# Patient Record
Sex: Female | Born: 1960 | Race: White | Hispanic: No | Marital: Married | Smoking: Never smoker
Health system: Southern US, Community
[De-identification: ages and names within clinical notes are randomized; demographics above are authoritative.]

## PROBLEM LIST (undated history)

## (undated) DIAGNOSIS — R51 Headache: Secondary | ICD-10-CM

## (undated) DIAGNOSIS — Z780 Asymptomatic menopausal state: Secondary | ICD-10-CM

## (undated) HISTORY — PX: TONSILLECTOMY: SUR1361

## (undated) HISTORY — DX: Headache: R51

## (undated) HISTORY — DX: Asymptomatic menopausal state: Z78.0

## (undated) HISTORY — PX: INGUINAL HERNIA REPAIR: SUR1180

---

## 2001-09-30 ENCOUNTER — Other Ambulatory Visit: Admission: RE | Admit: 2001-09-30 | Discharge: 2001-09-30 | Payer: Self-pay | Admitting: Obstetrics and Gynecology

## 2003-09-25 ENCOUNTER — Other Ambulatory Visit: Admission: RE | Admit: 2003-09-25 | Discharge: 2003-09-25 | Payer: Self-pay | Admitting: Obstetrics and Gynecology

## 2005-01-16 ENCOUNTER — Other Ambulatory Visit: Admission: RE | Admit: 2005-01-16 | Discharge: 2005-01-16 | Payer: Self-pay | Admitting: Obstetrics and Gynecology

## 2005-05-26 ENCOUNTER — Ambulatory Visit: Payer: Self-pay | Admitting: Internal Medicine

## 2005-06-12 ENCOUNTER — Ambulatory Visit: Payer: Self-pay | Admitting: Internal Medicine

## 2005-06-15 ENCOUNTER — Ambulatory Visit: Payer: Self-pay | Admitting: Internal Medicine

## 2006-03-12 ENCOUNTER — Ambulatory Visit: Payer: Self-pay | Admitting: Family Medicine

## 2006-04-20 ENCOUNTER — Ambulatory Visit: Payer: Self-pay | Admitting: Internal Medicine

## 2006-06-09 ENCOUNTER — Ambulatory Visit: Payer: Self-pay | Admitting: Internal Medicine

## 2006-09-07 ENCOUNTER — Ambulatory Visit: Payer: Self-pay | Admitting: Family Medicine

## 2006-10-01 ENCOUNTER — Ambulatory Visit: Payer: Self-pay | Admitting: Family Medicine

## 2006-10-13 ENCOUNTER — Ambulatory Visit: Payer: Self-pay | Admitting: Family Medicine

## 2006-11-12 ENCOUNTER — Ambulatory Visit: Payer: Self-pay | Admitting: Family Medicine

## 2006-11-12 LAB — CONVERTED CEMR LAB
Basophils Relative: 1.2 % — ABNORMAL HIGH (ref 0.0–1.0)
Iron: 48 ug/dL (ref 42–145)
Lymphocytes Relative: 37.4 % (ref 12.0–46.0)
MCHC: 33 g/dL (ref 30.0–36.0)
Monocytes Absolute: 0.4 10*3/uL (ref 0.2–0.7)
Monocytes Relative: 9.2 % (ref 3.0–11.0)
Neutro Abs: 2.1 10*3/uL (ref 1.4–7.7)
Platelets: 237 10*3/uL (ref 150–400)
Saturation Ratios: 10.2 % — ABNORMAL LOW (ref 20.0–50.0)
Transferrin: 336 mg/dL (ref >=212.0)

## 2007-02-14 DIAGNOSIS — R519 Headache, unspecified: Secondary | ICD-10-CM | POA: Insufficient documentation

## 2007-02-14 DIAGNOSIS — R51 Headache: Secondary | ICD-10-CM | POA: Insufficient documentation

## 2008-07-06 ENCOUNTER — Encounter: Admission: RE | Admit: 2008-07-06 | Discharge: 2008-07-06 | Payer: Self-pay | Admitting: Obstetrics and Gynecology

## 2008-08-15 ENCOUNTER — Ambulatory Visit: Payer: Self-pay | Admitting: Internal Medicine

## 2008-08-15 DIAGNOSIS — N390 Urinary tract infection, site not specified: Secondary | ICD-10-CM | POA: Insufficient documentation

## 2008-08-15 LAB — CONVERTED CEMR LAB
Bilirubin Urine: NEGATIVE
Nitrite: POSITIVE
Protein, U semiquant: NEGATIVE
Specific Gravity, Urine: <1.005
Urobilinogen, UA: 0.2
pH: 7.5

## 2008-08-16 ENCOUNTER — Encounter: Payer: Self-pay | Admitting: Internal Medicine

## 2008-08-16 LAB — CONVERTED CEMR LAB: RBC / HPF: NONE SEEN (ref <3)

## 2008-09-28 ENCOUNTER — Telehealth: Payer: Self-pay | Admitting: Internal Medicine

## 2008-10-02 ENCOUNTER — Ambulatory Visit: Payer: Self-pay | Admitting: Internal Medicine

## 2008-10-02 LAB — CONVERTED CEMR LAB
Bilirubin Urine: NEGATIVE
Ketones, urine, test strip: NEGATIVE
Nitrite: POSITIVE
Protein, U semiquant: NEGATIVE
RBC / HPF: NONE SEEN (ref <3)
Urobilinogen, UA: 0.2
pH: 7

## 2008-10-03 ENCOUNTER — Encounter: Payer: Self-pay | Admitting: Internal Medicine

## 2008-10-08 ENCOUNTER — Telehealth (INDEPENDENT_AMBULATORY_CARE_PROVIDER_SITE_OTHER): Payer: Self-pay | Admitting: *Deleted

## 2008-10-23 ENCOUNTER — Ambulatory Visit: Payer: Self-pay | Admitting: Internal Medicine

## 2008-10-24 ENCOUNTER — Encounter: Payer: Self-pay | Admitting: Internal Medicine

## 2008-10-24 LAB — CONVERTED CEMR LAB
Bilirubin Urine: NEGATIVE
Nitrite: NEGATIVE
Protein, ur: NEGATIVE mg/dL
Specific Gravity, Urine: 1.018 (ref 1.005–1.03)
Urobilinogen, UA: 0.2 (ref 0.0–1.0)

## 2008-10-30 ENCOUNTER — Telehealth (INDEPENDENT_AMBULATORY_CARE_PROVIDER_SITE_OTHER): Payer: Self-pay | Admitting: *Deleted

## 2009-04-01 ENCOUNTER — Ambulatory Visit: Payer: Self-pay | Admitting: Internal Medicine

## 2009-04-01 ENCOUNTER — Telehealth (INDEPENDENT_AMBULATORY_CARE_PROVIDER_SITE_OTHER): Payer: Self-pay | Admitting: *Deleted

## 2009-08-01 ENCOUNTER — Encounter: Admission: RE | Admit: 2009-08-01 | Discharge: 2009-08-01 | Payer: Self-pay | Admitting: Obstetrics and Gynecology

## 2010-10-07 ENCOUNTER — Encounter: Admission: RE | Admit: 2010-10-07 | Discharge: 2010-10-07 | Payer: Self-pay | Admitting: Obstetrics and Gynecology

## 2011-12-02 ENCOUNTER — Other Ambulatory Visit: Payer: Self-pay | Admitting: Obstetrics and Gynecology

## 2011-12-02 DIAGNOSIS — R928 Other abnormal and inconclusive findings on diagnostic imaging of breast: Secondary | ICD-10-CM

## 2011-12-08 ENCOUNTER — Ambulatory Visit
Admission: RE | Admit: 2011-12-08 | Discharge: 2011-12-08 | Disposition: A | Discharge status: Home / Self Care | Payer: PRIVATE HEALTH INSURANCE | Source: Ambulatory Visit | Attending: Obstetrics and Gynecology | Admitting: Obstetrics and Gynecology

## 2011-12-08 ENCOUNTER — Other Ambulatory Visit: Payer: Self-pay | Admitting: Obstetrics and Gynecology

## 2011-12-08 DIAGNOSIS — R928 Other abnormal and inconclusive findings on diagnostic imaging of breast: Secondary | ICD-10-CM

## 2011-12-09 ENCOUNTER — Telehealth: Payer: Self-pay

## 2011-12-09 NOTE — Telephone Encounter (Signed)
Message left on voicemail: Patient would like to be a kidney donor, patient will be 51 in March. Patient was told she will need to have a colonoscopy as one of the pre-qualifiying factors.   Last appointment 07/2008. Dr.Paz please advise on patient's request for referral for colonoscopy (forward to [facility])

## 2011-12-09 NOTE — Telephone Encounter (Signed)
I haven't seen her in more than 3 years, she really needs a physical exam before anything else. Schedule it here if so desired or with her gynecologist

## 2011-12-10 NOTE — Telephone Encounter (Signed)
Spoke with patient, patient will contact GYN

## 2012-02-16 HISTORY — PX: KIDNEY DONATION: SHX685

## 2012-07-05 ENCOUNTER — Ambulatory Visit (INDEPENDENT_AMBULATORY_CARE_PROVIDER_SITE_OTHER): Payer: PRIVATE HEALTH INSURANCE | Admitting: Family Medicine

## 2012-07-05 ENCOUNTER — Encounter: Payer: Self-pay | Admitting: Family Medicine

## 2012-07-05 VITALS — BP 100/64 | HR 71 | Temp 98.3°F | Wt 112.4 lb

## 2012-07-05 DIAGNOSIS — J069 Acute upper respiratory infection, unspecified: Secondary | ICD-10-CM

## 2012-07-05 NOTE — Progress Notes (Signed)
  Subjective:     Monica Sweeney is a 51 y.o. female here for evaluation of a cough. Onset of symptoms was 1 month ago. Symptoms have been unchanged since that time. The cough is mostly dry and is aggravated by  activity. Associated symptoms include: postnasal drip and sputum production. Patient does not have a history of asthma. Patient does not have a history of environmental allergens. Patient has not traveled recently. Patient does not have a history of smoking. Patient has not had a previous chest x-ray. Patient has not had a PPD done.  The following portions of the patient's history were reviewed and updated as appropriate: allergies, current medications, past family history, past medical history, past social history, past surgical history and problem list.  Review of Systems Pertinent items are noted in HPI.    Objective:    Oxygen saturation 96% on room air BP 100/64  Pulse 71  Temp 98.3 F (36.8 C) (Oral)  Wt 112 lb 6.4 oz (50.984 kg)  SpO2 96% General appearance: alert, cooperative, appears stated age and no distress Ears: normal TM's and external ear canals both ears Nose: Nares normal. Septum midline. Mucosa normal. No drainage or sinus tenderness., clear discharge, turbinates red, no sinus tenderness Throat: abnormal findings: mild oropharyngeal erythema and pnd Neck: no adenopathy, supple, symmetrical, trachea midline and thyroid not enlarged, symmetric, no tenderness/mass/nodules Lungs: clear to auscultation bilaterally Heart: S1, S2 normal    Assessment:    URI with Post Nasal Drip    Plan:    Explained lack of efficacy of antibiotics in viral disease. Avoid exposure to tobacco smoke and fumes. Call if shortness of breath worsens, blood in sputum, change in character of cough, development of fever or chills, inability to maintain nutrition and hydration. Avoid exposure to tobacco smoke and fumes. Trial of antihistamines. Trial of steroid nasal spray.

## 2012-07-05 NOTE — Patient Instructions (Signed)

## 2012-07-20 ENCOUNTER — Ambulatory Visit (INDEPENDENT_AMBULATORY_CARE_PROVIDER_SITE_OTHER): Payer: PRIVATE HEALTH INSURANCE | Admitting: Internal Medicine

## 2012-07-20 VITALS — BP 112/76 | HR 75 | Temp 98.1°F | Wt 112.0 lb

## 2012-07-20 DIAGNOSIS — R05 Cough: Secondary | ICD-10-CM

## 2012-07-20 DIAGNOSIS — R059 Cough, unspecified: Secondary | ICD-10-CM

## 2012-07-20 MED ORDER — HYDROCODONE-HOMATROPINE 5-1.5 MG/5ML PO SYRP: 5.0000 mL | ORAL_SOLUTION | Freq: Three times a day (TID) | ORAL | Status: AC | PRN

## 2012-07-20 MED ORDER — DOXYCYCLINE HYCLATE 100 MG PO TABS: 100.0000 mg | ORAL_TABLET | Freq: Two times a day (BID) | ORAL | Status: AC

## 2012-07-20 NOTE — Patient Instructions (Addendum)
Rest, fluids  For cough, take Mucinex DM twice a day as needed  If the cough continue, use hydrocodone as needed Prilosec 20 mg OTC one every morning before breakfast for 2 weeks Take the antibiotic as prescribed  (doxycycline) Call if no better in few days Call anytime if the symptoms are severe

## 2012-07-20 NOTE — Assessment & Plan Note (Signed)
Persisting cough, not better with Claritin and Flonase. Atypical infection?. Plan: Doxycycline, cough suppression, Prilosec. If not better will need further eval including a chest x-ray.

## 2012-07-20 NOTE — Progress Notes (Signed)
  Subjective:    Patient ID: Monica Sweeney, female    DOB: 05/12/1961, 51 y.o.   MRN: [id number]  HPI Acute visit Was seen 2 weeks ago with cough, was prescribed Claritin and Flonase, continue with cough. She brings up very little sputum, no hemoptysis. Cough is at times severe and can't stop.  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 Denies itchy eyes itchy nose or sneezing. Sense of smell is quite decrease    Objective:   Physical Exam General -- alert, well-developed, and well-nourished.   HEENT -- TMs normal, throat w/o redness, face symmetric and not tender to palpation, nose slt congested Lungs -- normal respiratory effort, no intercostal retractions, no accessory muscle use, and normal breath sounds.   Heart-- normal rate, regular rhythm, no murmur, and no gallop.   Extremities-- no pretibial edema bilaterally Psych-- Cognition and judgment appear intact. Alert and cooperative with normal attention span and concentration.  not anxious appearing and not depressed appearing.       Assessment & Plan:

## 2012-07-21 ENCOUNTER — Encounter: Payer: Self-pay | Admitting: Internal Medicine

## 2012-07-26 ENCOUNTER — Telehealth: Payer: Self-pay | Admitting: Internal Medicine

## 2012-07-26 MED ORDER — AZITHROMYCIN 250 MG PO TABS: ORAL_TABLET | ORAL | Status: AC

## 2012-07-26 NOTE — Telephone Encounter (Signed)
Refill done.  

## 2012-07-26 NOTE — Telephone Encounter (Signed)
I don't see any problems taking doxycycline however if the patient is concerned we can switch her to a Zpack

## 2012-07-26 NOTE — Telephone Encounter (Signed)
Pt called states she only has 1-kidney and the doxycycline states not to take if you have kidney issues. Pt does not want to take as it could be a possible health risk. Please call patient back on cell [phone] as soon as possible

## 2012-07-26 NOTE — Telephone Encounter (Signed)
Please advise 

## 2012-09-02 ENCOUNTER — Ambulatory Visit (INDEPENDENT_AMBULATORY_CARE_PROVIDER_SITE_OTHER): Payer: PRIVATE HEALTH INSURANCE | Admitting: Internal Medicine

## 2012-09-02 VITALS — BP 102/66 | HR 69 | Wt 111.0 lb

## 2012-09-02 DIAGNOSIS — Z905 Acquired absence of kidney: Secondary | ICD-10-CM

## 2012-09-02 DIAGNOSIS — N390 Urinary tract infection, site not specified: Secondary | ICD-10-CM

## 2012-09-02 LAB — POCT URINALYSIS DIPSTICK
Ketones, UA: NEGATIVE
Protein, UA: NEGATIVE
Urobilinogen, UA: 0.2
pH, UA: 7.5

## 2012-09-02 MED ORDER — CIPROFLOXACIN HCL 500 MG PO TABS: 500.0000 mg | ORAL_TABLET | Freq: Two times a day (BID) | ORAL | Status: DC

## 2012-09-02 NOTE — Patient Instructions (Addendum)
Drink plenty of fluids, ciprofloxacin twice a day for one week. We are sending a urine culture, will call you with results. If you have fever, chills, nausea or you are not completely well in 10 days, let us know

## 2012-09-02 NOTE — Progress Notes (Signed)
  Subjective:    Patient ID: Monica Sweeney, female    DOB: 06-13-1961, 51 y.o.   MRN: [id number]  HPI Acute visit 3 days ago had transient dysuria, got better after she got some cranberry juice tablets. The next day, developed right flank pain, was mild, pain is now almost resolved, not 100% gone.   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 No nausea vomiting No gross hematuria Not rash in the back. Did have some lower abdominal discomfort.     Objective:   Physical Exam General -- alert, well-developed, and well-nourished.    Abdomen--soft, non-tender, no distention, no masses, no HSM, no guarding, and no rigidity.  No CVA tenderness on either side Extremities-- no pretibial edema bilaterally  Neurologic-- alert & oriented X3 and strength normal in all extremities. Psych-- Cognition and judgment appear intact. Alert and cooperative with normal attention span and concentration.  not anxious appearing and not depressed appearing.       Assessment & Plan:   UTI, Symptoms consistent with a UTI, urinalysis shows some blood. She has a single right kidney, and some flank discomfort consequently it was prescribe abx for 7 days.  Urine culture pending Will check a BMP to ensure kidney function is normal.

## 2012-09-03 ENCOUNTER — Encounter: Payer: Self-pay | Admitting: Internal Medicine

## 2012-09-03 DIAGNOSIS — Z905 Acquired absence of kidney: Secondary | ICD-10-CM | POA: Insufficient documentation

## 2012-09-03 LAB — BASIC METABOLIC PANEL
BUN: 15 mg/dL (ref 6–23)
CO2: 28 mEq/L (ref 19–32)
Calcium: 10.7 mg/dL — ABNORMAL HIGH (ref 8.4–10.5)
Glucose, Bld: 85 mg/dL (ref 70–99)
Potassium: 3.8 mEq/L (ref 3.5–5.3)
Sodium: 140 mEq/L (ref 135–145)

## 2012-09-05 ENCOUNTER — Encounter: Payer: Self-pay | Admitting: *Deleted

## 2012-09-08 ENCOUNTER — Telehealth: Payer: Self-pay

## 2012-09-08 NOTE — Telephone Encounter (Signed)
Pt called in wanting copy of last OV for another appt. Advised pt I would mail it.    MW

## 2012-09-28 ENCOUNTER — Ambulatory Visit
Admission: RE | Admit: 2012-09-28 | Discharge: 2012-09-28 | Disposition: A | Discharge status: Home / Self Care | Payer: PRIVATE HEALTH INSURANCE | Source: Ambulatory Visit | Attending: Internal Medicine | Admitting: Internal Medicine

## 2012-09-28 ENCOUNTER — Ambulatory Visit (INDEPENDENT_AMBULATORY_CARE_PROVIDER_SITE_OTHER): Payer: PRIVATE HEALTH INSURANCE | Admitting: Internal Medicine

## 2012-09-28 ENCOUNTER — Encounter: Payer: Self-pay | Admitting: Internal Medicine

## 2012-09-28 VITALS — BP 98/64 | HR 97 | Temp 98.6°F | Wt 115.0 lb

## 2012-09-28 DIAGNOSIS — R059 Cough, unspecified: Secondary | ICD-10-CM

## 2012-09-28 DIAGNOSIS — R05 Cough: Secondary | ICD-10-CM

## 2012-09-28 DIAGNOSIS — R51 Headache: Secondary | ICD-10-CM

## 2012-09-28 DIAGNOSIS — N390 Urinary tract infection, site not specified: Secondary | ICD-10-CM

## 2012-09-28 LAB — POCT URINALYSIS DIPSTICK
Bilirubin, UA: NEGATIVE
Ketones, UA: NEGATIVE
Protein, UA: NEGATIVE
pH, UA: 6.5

## 2012-09-28 NOTE — Progress Notes (Signed)
  Subjective:    Patient ID: Monica Sweeney, female    DOB: [birth year], 51 y.o.   MRN: [id number]  HPI Acute visit Was awakened in the middle of the night last night with severe headache, associated with nausea , she vomited twice and felt dizzy. The headache is not the worst of her life but is different from her usual migraines, located mostly at the top of the head.  She continue with cough on and off despite taking Prilosec.  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 No neck stiffness but some soreness No sinus pain, postnasal dripping at baseline and mild. No abdominal pain or diarrhea.    Objective:   Physical Exam  General -- alert, well-developed, and well-nourished.   Neck --FROM  HEENT-- tympanic membranes normal, nose is slightly congested, face symmetric and not tender to palpation. Without redness Lungs -- normal respiratory effort, no intercostal retractions, no accessory muscle use, and normal breath sounds.   Heart-- normal rate, regular rhythm, no murmur, and no gallop.   Abdomen--soft, non-tender, no distention, no masses, no HSM, no guarding, and no rigidity.   Neurologic-- alert & oriented X3, speech, gait and motor are intact. Pupils equal and reactive, EOMI, DTRs symmetric. Psych-- Cognition and judgment appear intact. Alert and cooperative with normal attention span and concentration.  not anxious appearing and not depressed appearing.      Assessment & Plan:  Pt's cell [phone]

## 2012-09-28 NOTE — Assessment & Plan Note (Addendum)
Presents with acute, splitting headache, associated with 2 episodes of nausea and some dizziness. Headache is different to previous migraines. Neurological exam is normal. I think is important to rule out the ICB, will schedule a CT. If CT is negative, will prescribe Tylenol. Patient knows to call me (even if CT is negative) if she has ongoing headache for > than  3 days or if she has another severe headache. Addendum, CT negative, patient aware

## 2012-09-28 NOTE — Patient Instructions (Addendum)
Tylenol  500 mg OTC 2 tabs a day every 8 hours as needed for pain --- Will get a CT of the head and  a chest x-ray today If the headache is not going away in 3 days or gets worse, please call us

## 2012-09-28 NOTE — Assessment & Plan Note (Addendum)
Ongoing issue, on Prilosec. Plan: Chest x-ray Refer to Dr. Sherene Sires Addendum, chest x-ray negative except for bronchitis. Patient aware

## 2012-09-28 NOTE — Assessment & Plan Note (Signed)
Status post Cipro, now asymptomatic, U dip positive. Plan: Urine culture, treat if appropriate

## 2012-10-07 ENCOUNTER — Encounter: Payer: Self-pay | Admitting: Internal Medicine

## 2012-10-07 ENCOUNTER — Ambulatory Visit (INDEPENDENT_AMBULATORY_CARE_PROVIDER_SITE_OTHER): Payer: PRIVATE HEALTH INSURANCE | Admitting: Internal Medicine

## 2012-10-07 VITALS — BP 104/68 | HR 90 | Temp 98.1°F | Ht 64.0 in | Wt 111.6 lb

## 2012-10-07 DIAGNOSIS — R05 Cough: Secondary | ICD-10-CM

## 2012-10-07 DIAGNOSIS — R059 Cough, unspecified: Secondary | ICD-10-CM

## 2012-10-07 MED ORDER — FAMOTIDINE 20 MG PO TABS: ORAL_TABLET | ORAL | Status: DC

## 2012-10-07 MED ORDER — PANTOPRAZOLE SODIUM 40 MG PO TBEC: 40.0000 mg | DELAYED_RELEASE_TABLET | Freq: Every day | ORAL | Status: DC

## 2012-10-07 NOTE — Assessment & Plan Note (Signed)
The most common causes of chronic cough in immunocompetent adults include the following: upper airway cough syndrome (UACS), previously referred to as postnasal drip syndrome (PNDS), which is caused by variety of rhinosinus conditions; (2) asthma; (3) GERD; (4) chronic bronchitis from cigarette smoking or other inhaled environmental irritants; (5) nonasthmatic eosinophilic bronchitis; and (6) bronchiectasis.   These conditions, singly or in combination, have accounted for up to 94% of the causes of chronic cough in prospective studies.   Other conditions have constituted no >6% of the causes in prospective studies These have included bronchogenic carcinoma, chronic interstitial pneumonia, sarcoidosis, left ventricular failure, ACEI-induced cough, and aspiration from a condition associated with pharyngeal dysfunction.   This is most likely a form of  Classic Upper airway cough syndrome, so named because it's frequently impossible to sort out how much is  CR/sinusitis with freq throat clearing (which can be related to primary GERD)   vs  causing  secondary (" extra esophageal")  GERD from wide swings in gastric pressure that occur with throat clearing, often  promoting self use of mint and menthol lozenges that reduce the lower esophageal sphincter tone and exacerbate the problem further in a cyclical fashion.   These are the same pts (now being labeled as having "irritable larynx syndrome" by some cough centers) who not infrequently have a history of having failed to tolerate ace inhibitors,  dry powder inhalers or biphosphonates or report having atypical reflux symptoms that don't respond to standard doses of PPI , and are easily confused as having aecopd or asthma flares by even experienced allergists/ pulmonologists.   Coughs to point of gag/vomiting so clearly is refluxing. Unfortunately we don't have good options to treat the non-acid components of gerd so need to eliminate any acid component at all  and follow strict diet to see if this eliminates the cough and if so may be a candidate for NF

## 2012-10-07 NOTE — Patient Instructions (Addendum)
Try Pantoprazole 40 mg   Take 30-60 min before first meal of the day and Pepcid 20 mg one bedtime until cough is completely gone for at least a week without the need for cough suppression   GERD (REFLUX)  is an extremely common cause of respiratory symptoms like a cough, many times with no significant heartburn at all.    It can be treated with medication, but also with lifestyle changes including avoidance of late meals, excessive alcohol, smoking cessation, and avoid fatty foods, chocolate, peppermint, colas, red wine, and acidic juices such as orange juice.  NO MINT OR MENTHOL PRODUCTS SO NO COUGH DROPS  USE SUGARLESS CANDY INSTEAD (jolley ranchers or Stover's)  NO OIL BASED VITAMINS - use powdered substitutes.  Best cough medication is robitussin DM   No follow up is needed if  you are satisfied with your treatment plan let your doctor know and he can either refill your medications or you can return here when your prescription runs out.     If in any way you are not 100% satisfied,  please tell us.  If 100% better, tell your friends!

## 2012-10-07 NOTE — Progress Notes (Signed)
  Subjective:    Patient ID: Monica Sweeney, female    DOB: November 27, 1960 MRN: [id number]  HPI  51 yowf no resp problems at all and healthy except for recurrent cough episodic and responsive to short courses of  prilosec  since around 2007 and felt to be a healthy  at donor @ [facility] kidney donation February 16 2012 but with recurrent cough Aug 2013 so started prilosec back  But cough persisted so referred 10/07/2012 to pulmonary clinic by Dr Drue Novel.  10/07/2012 1st pulmonary eval/ Bao Bazen cc cough on off x 5-6 years previously better on prilosec and much worse since August 2013 taking prilosec p bfast each am.  Cough is daily, min productive, assoc gagging to point of vomit and loss of breath.   Worse p few minutes supine and does wake her up. Hoarse/ cough with talking.  Not sob unless coughing. Denies overt hb or sinus complaints and had a neg ct head/ neg sinus dz on 09/28/12   Also denies any obvious fluctuation of symptoms with weather or environmental changes or other aggravating or alleviating factors except as outlined above        Review of Systems  Constitutional: Negative for fever and unexpected weight change.  HENT: Positive for sore throat ( pat feels something in throat--scratchy when swallowing at times) and sneezing. Negative for ear pain, nosebleeds, congestion, rhinorrhea, trouble swallowing, dental problem, postnasal drip and sinus pressure.   Eyes: Negative for redness and itching.  Respiratory: Positive for cough, chest tightness and shortness of breath. Negative for wheezing.   Cardiovascular: Negative for palpitations and leg swelling.  Gastrointestinal: Negative for nausea and vomiting.       Acid reflux  Genitourinary: Negative for dysuria.  Musculoskeletal: Negative for joint swelling.  Skin: Negative for rash.  Neurological: Negative for headaches.  Hematological: Does not bruise/bleed easily.  Psychiatric/Behavioral: Negative for dysphoric mood. The patient is not  nervous/anxious.        Objective:   Physical Exam Wt Readings from Last 3 Encounters:  10/07/12 111 lb 9.6 oz (50.621 kg)  09/28/12 115 lb (52.164 kg)  09/02/12 111 lb (50.349 kg)    HEENT: nl dentition, turbinates, and orophanx. Nl external ear canals without cough reflex   NECK :  without JVD/Nodes/TM/ nl carotid upstrokes bilaterally   LUNGS: no acc muscle use, clear to A and P bilaterally without cough on insp or exp maneuvers   CV:  RRR  no s3 or murmur or increase in P2, no edema   ABD:  soft and nontender with nl excursion in the supine position. No bruits or organomegaly, bowel sounds nl  MS:  warm without deformities, calf tenderness, cyanosis or clubbing  SKIN: warm and dry without lesions    NEURO:  alert, approp, no deficits     cxr 09/28/12 Mild bronchitic change without definite acute cardiopulmonary  disease.       Assessment & Plan:

## 2013-02-21 ENCOUNTER — Other Ambulatory Visit: Payer: Self-pay | Admitting: Internal Medicine

## 2013-04-05 ENCOUNTER — Encounter: Payer: Self-pay | Admitting: Internal Medicine

## 2013-04-05 ENCOUNTER — Ambulatory Visit (INDEPENDENT_AMBULATORY_CARE_PROVIDER_SITE_OTHER): Payer: PRIVATE HEALTH INSURANCE | Admitting: Internal Medicine

## 2013-04-05 VITALS — BP 102/74 | HR 64 | Wt 110.0 lb

## 2013-04-05 DIAGNOSIS — M7711 Lateral epicondylitis, right elbow: Secondary | ICD-10-CM

## 2013-04-05 DIAGNOSIS — M7710 Lateral epicondylitis, unspecified elbow: Secondary | ICD-10-CM

## 2013-04-05 MED ORDER — PREDNISONE 10 MG PO TABS: ORAL_TABLET | ORAL | Status: DC

## 2013-04-05 NOTE — Progress Notes (Signed)
  Subjective:    Patient ID: Monica Sweeney, female    DOB: 05-08-1961, 52 y.o.   MRN: [id number]  HPI Acute visit 6 weeks history of pain at the right elbow, pain is not getting any better, increases by picking things with her right arm or shaking hands ; initially the pain was only at the elbow, now extends proximately and distally. Has not taken any medication. No injury per se.  Past Medical History  Diagnosis Date  . Headache   . Menopause    Past Surgical History  Procedure Laterality Date  . Kidney donation  02/16/2012  . Tonsillectomy    . Inguinal hernia repair     History   Social History  . Marital Status: Married    Spouse Name: N/A    Number of Children: 2  . Years of Education: N/A   Occupational History  . Animator at [employer]   Social History Main Topics  . Smoking status: Never Smoker   . Smokeless tobacco: Never Used  . Alcohol Use: Yes     Comment: occassional  . Drug Use: No  . Sexually Active: Not on file   Other Topics Concern  . Not on file   Social History Narrative          Review of Systems Patient is very active but does not play any racquet sportsn. Denies any neck pain per se . Elbow is not swollen or red. No fever chills    Objective:   Physical Exam General -- alert, well-developed, NAD Neck --full range of motion and not tender to palpation at the cervical spine Extremities-- no pretibial edema bilaterally Left elbow normal. Right elbow without swelling or deformities, slightly tender at the external epicondyle.  Neurologic-- alert & oriented X3 ; DTRs and  strength normal in all extremities. Psych-- Cognition and judgment appear intact. Alert and cooperative with normal attention span and concentration.  not anxious appearing and not depressed appearing.       Assessment & Plan:

## 2013-04-05 NOTE — Assessment & Plan Note (Addendum)
Symptoms consistent with tennis elbow, dx discussed with the patient Avoid Motrin, Celebrex etc. Due to single kidney . Plan: Prednisone, Tylenol, ice, brace, stretching. Printed extensive information for the patient from up-to-date. Will call if no better in 3 weeks for a referral

## 2013-04-05 NOTE — Patient Instructions (Addendum)
Times in the every night and also immediately after exercise. Prednisone as prescribed Stretching twice a day Tylenol  500 mg OTC 2 tabs a day every 8 hours as needed for pain Brace OTC Call if no better in 2-3 weeks

## 2013-04-06 ENCOUNTER — Encounter: Payer: Self-pay | Admitting: Internal Medicine

## 2013-05-16 ENCOUNTER — Encounter: Payer: Self-pay | Admitting: Family Medicine

## 2013-05-16 ENCOUNTER — Ambulatory Visit (INDEPENDENT_AMBULATORY_CARE_PROVIDER_SITE_OTHER): Payer: PRIVATE HEALTH INSURANCE | Admitting: Family Medicine

## 2013-05-16 ENCOUNTER — Telehealth: Payer: Self-pay | Admitting: Internal Medicine

## 2013-05-16 VITALS — BP 94/64 | HR 62 | Temp 98.4°F

## 2013-05-16 DIAGNOSIS — R300 Dysuria: Secondary | ICD-10-CM

## 2013-05-16 LAB — POCT URINALYSIS DIPSTICK
Glucose, UA: NEGATIVE
Ketones, UA: NEGATIVE
Protein, UA: NEGATIVE
Spec Grav, UA: <=1.005

## 2013-05-16 MED ORDER — CIPROFLOXACIN HCL 500 MG PO TABS: 500.0000 mg | ORAL_TABLET | Freq: Two times a day (BID) | ORAL | Status: AC

## 2013-05-16 NOTE — Telephone Encounter (Signed)
Spoke to pt, scheduled appt w/ Dr. Laury Axon @ 4pm. Pt could not come in any earlier.

## 2013-05-16 NOTE — Patient Instructions (Addendum)
Urinary Tract Infection  Urinary tract infections (UTIs) can develop anywhere along your urinary tract. Your urinary tract is your body's drainage system for removing wastes and extra water. Your urinary tract includes two kidneys, two ureters, a bladder, and a urethra. Your kidneys are a pair of bean-shaped organs. Each kidney is about the size of your fist. They are located below your ribs, one on each side of your spine.  CAUSES  Infections are caused by microbes, which are microscopic organisms, including fungi, viruses, and bacteria. These organisms are so small that they can only be seen through a microscope. Bacteria are the microbes that most commonly cause UTIs.  SYMPTOMS   Symptoms of UTIs may vary by age and gender of the patient and by the location of the infection. Symptoms in young women typically include a frequent and intense urge to urinate and a painful, burning feeling in the bladder or urethra during urination. Older women and men are more likely to be tired, shaky, and weak and have muscle aches and abdominal pain. A fever may mean the infection is in your kidneys. Other symptoms of a kidney infection include pain in your back or sides below the ribs, nausea, and vomiting.  DIAGNOSIS  To diagnose a UTI, your caregiver will ask you about your symptoms. Your caregiver also will ask to provide a urine sample. The urine sample will be tested for bacteria and white blood cells. White blood cells are made by your body to help fight infection.  TREATMENT   Typically, UTIs can be treated with medication. Because most UTIs are caused by a bacterial infection, they usually can be treated with the use of antibiotics. The choice of antibiotic and length of treatment depend on your symptoms and the type of bacteria causing your infection.  HOME CARE INSTRUCTIONS   If you were prescribed antibiotics, take them exactly as your caregiver instructs you. Finish the medication even if you feel better after you  have only taken some of the medication.   Drink enough water and fluids to keep your urine clear or pale yellow.   Avoid caffeine, tea, and carbonated beverages. They tend to irritate your bladder.   Empty your bladder often. Avoid holding urine for long periods of time.   Empty your bladder before and after sexual intercourse.   After a bowel movement, women should cleanse from front to back. Use each tissue only once.  SEEK MEDICAL CARE IF:    You have back pain.   You develop a fever.   Your symptoms do not begin to resolve within 3 days.  SEEK IMMEDIATE MEDICAL CARE IF:    You have severe back pain or lower abdominal pain.   You develop chills.   You have nausea or vomiting.   You have continued burning or discomfort with urination.  MAKE SURE YOU:    Understand these instructions.   Will watch your condition.   Will get help right away if you are not doing well or get worse.  Document Released: 08/12/2005 Document Revised: 05/03/2012 Document Reviewed: 12/11/2011  [employer] Patient Information 2014 [employer]

## 2013-05-16 NOTE — Telephone Encounter (Signed)
Patient Information:  Caller Name: Zayonna  Phone: [phone]  Patient: Monica Sweeney, Monica Sweeney  Gender: Female  DOB: 12-May-1961  Age: 52 Years  PCP: Marga Melnick  Pregnant: No  Office Follow Up:  Does the office need to follow up with this patient?: Yes  Instructions For The Office: She worked out this week and did not change her underwear afterwards and this is a trigger for her to have UTI's...she has painful urination with some cloudy appearance starting and requesting antibiotic called into pharmacy. She has one kidney.  RN Note:  She is calling regarding potential UTI which she gets "frequently" according to her, this would be her 2nd one this year. It is having Mild-Moderate interruption of ADL's, uncomfortable and would be interrupting desire for sexual intercourse with her husband. She only has one kidney, donated the other one.She is drinking fluid and urinating but has pain with urination and slightly cloudy appearance. SHE IS REQUESTING MEDICINE BE CALLED INTO THE [facility], [phone]. PLEASE CALL AND LET HER KNOW IF SHE NEEDS APOINTMENT VERSUS COMING IN TO OFFICE TODAY, 05/16/2013.  Symptoms  Reason For Call & Symptoms: UTI (15 months ago donated kidney) pain with urination and looks like on the verge of cloudy.  Reviewed Health History In EMR: Yes  Reviewed Medications In EMR: Yes  Reviewed Allergies In EMR: Yes  Reviewed Surgeries / Procedures: Yes  Date of Onset of Symptoms: 05/14/2013 OB / GYN:  LMP: Unknown  Guideline(s) Used:  Urination Pain - Female  Disposition Per Guideline:   See Today in Office  Reason For Disposition Reached:   Age > 50 years  Advice Given:  Fluids:   Drink extra fluids. Drink 8-10 glasses of liquids a day (Reason: to produce a dilute, non-irritating urine).  Cranberry Juice:   Some people think that drinking cranberry juice may help in fighting urinary tract infections. However, there is no good research that has ever proved  this.  Dosage Cranberry Juice Cocktail: 8 oz (240 ml) twice a day.  Dosage 100% Cranberry Juice: 1 oz (30 ml) twice a day.  Cranberry Juice:   Some people think that drinking cranberry juice may help in fighting urinary tract infections. However, there is no good research that has ever proved this.  Dosage Cranberry Juice Cocktail: 8 oz (240 ml) twice a day.  Dosage 100% Cranberry Juice: 1 oz (30 ml) twice a day.  Caution: Do not drink more than 16 oz (480 ml). Here is the reason: too much cranberry juice can also be irritating to the bladder.  Warm Saline SITZ Baths to Reduce Pain:  Sit in a warm saline bath for 20 minutes to cleanse the area and to reduce pain. Add 2 oz. of table salt or baking soda to a tub of water.  Call Back If:  You become worse.  RN Overrode Recommendation:  Patient Requests Prescription  SHE IS FAMILIAR WITH UTI, AND REQUESTING MEDICINE BE CALLED INTO PHARMACY.

## 2013-05-16 NOTE — Progress Notes (Signed)
  Subjective:    Monica Sweeney is a 52 y.o. female who complains of burning with urination and frequency. She has had symptoms for 3 days. Patient also complains of none. Patient denies back pain, congestion, cough, fever, headache, rhinitis, sorethroat, stomach ache and vaginal discharge. Patient does not have a history of recurrent UTI. Patient does not have a history of pyelonephritis.   The following portions of the patient's history were reviewed and updated as appropriate: allergies, current medications, past family history, past medical history, past social history, past surgical history and problem list.  Review of Systems Pertinent items are noted in HPI.    Objective:    BP 94/64  Pulse 62  Temp(Src) 98.4 F (36.9 C) (Oral)  SpO2 96% General appearance: alert, cooperative, appears stated age and no distress  Laboratory:  Urine dipstick: small for hemoglobin and 3+ for leukocyte esterase.   Micro exam: not done.    Assessment:    UTI     Plan:    Medications: ciprofloxacin. Maintain adequate hydration. Follow up if symptoms not improving, and as needed.

## 2013-05-18 LAB — URINE CULTURE: Colony Count: >=100000

## 2013-06-01 ENCOUNTER — Ambulatory Visit: Payer: PRIVATE HEALTH INSURANCE | Admitting: Family Medicine

## 2013-12-01 IMAGING — CR DG CHEST 2V
2 series · 2 of 2 positions shown · non-contrast
Comparison: None.

CLINICAL DATA: Chronic cough, nonsmoker

CHEST - 2 VIEW

[w chest pa]
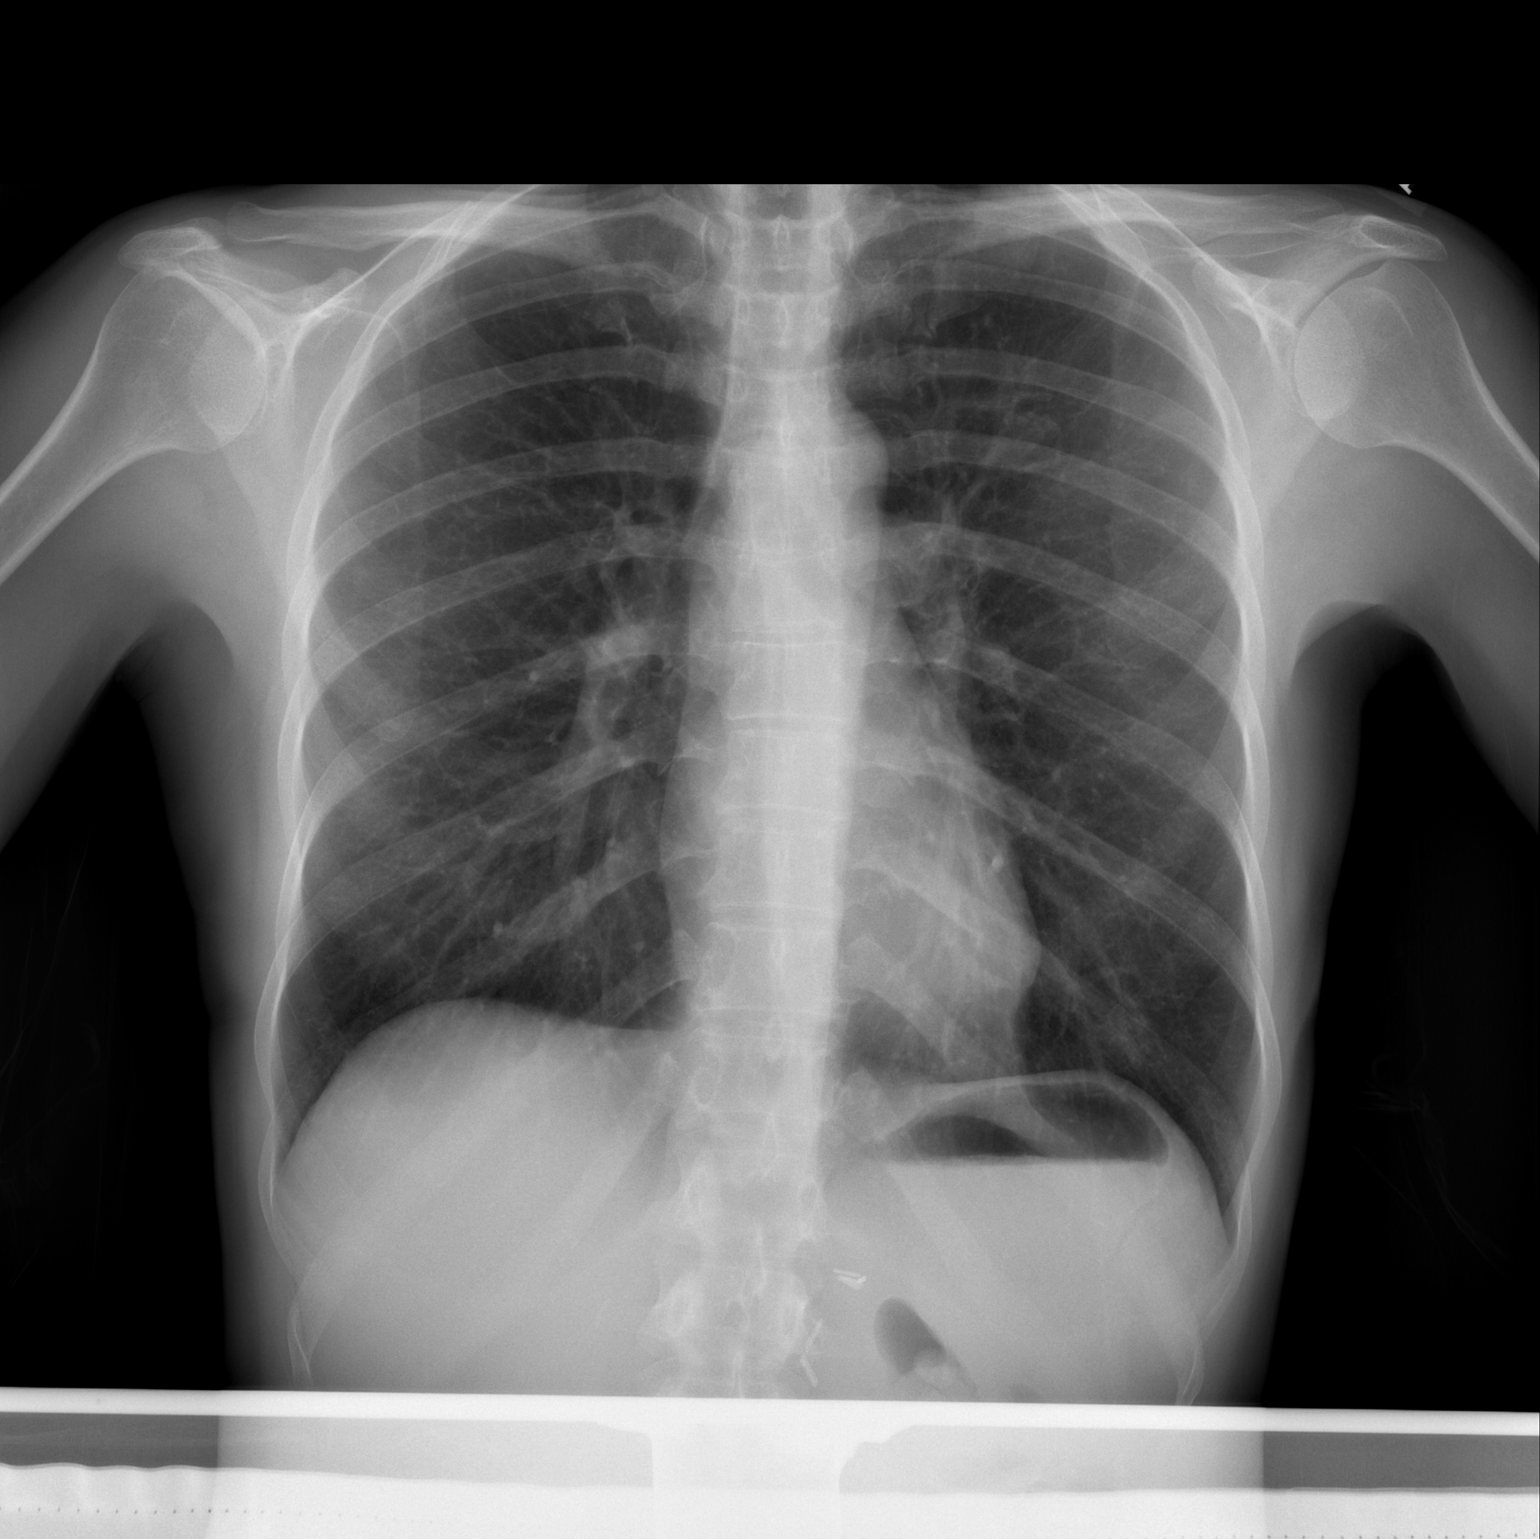

[w chest lat]
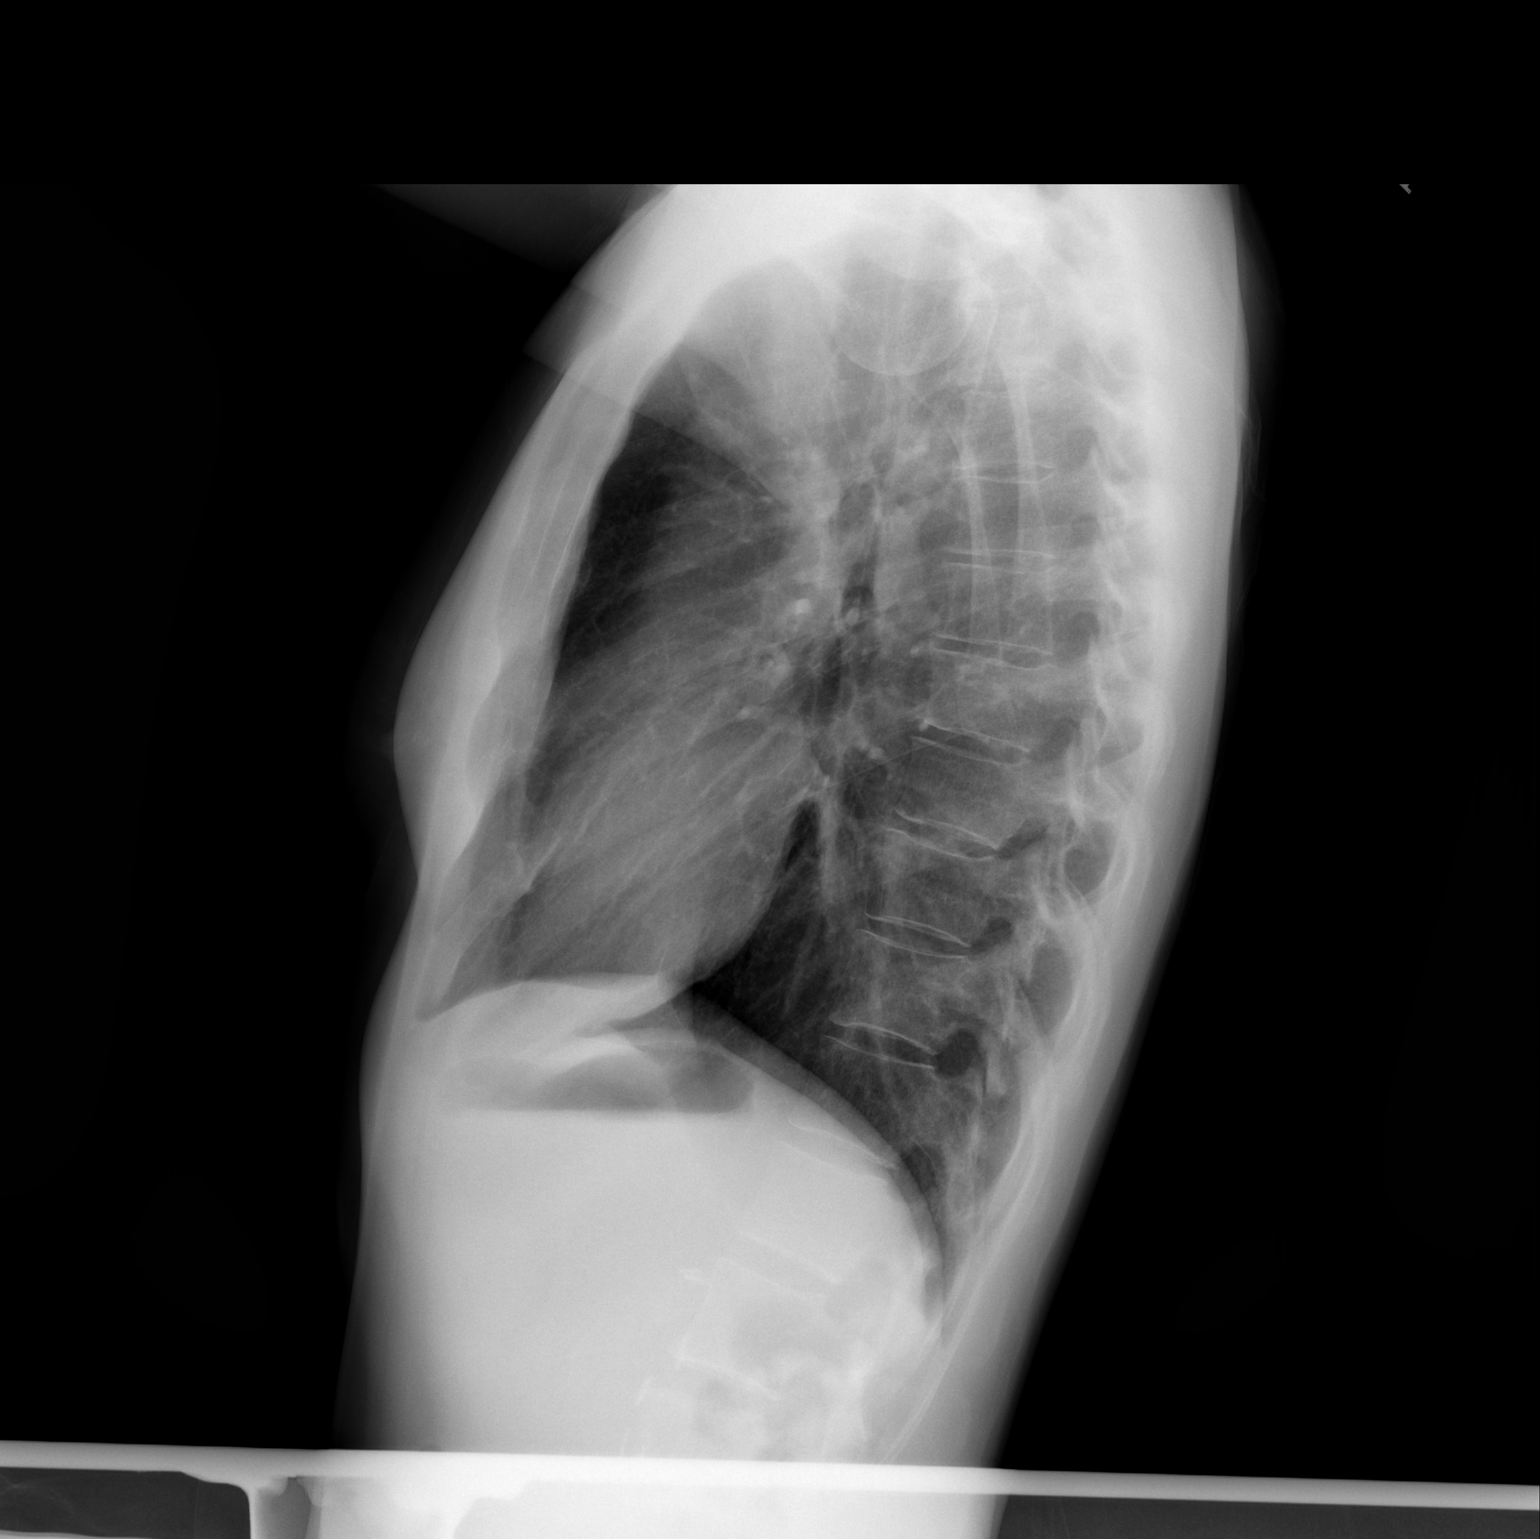

[2 of 2 positions shown; findings below may reference images not displayed]

FINDINGS: Normal cardiac silhouette and mediastinal contours. There is mild
diffuse thickening of the pulmonary interstitium.  No focal
airspace opacities.  No pleural effusion or pneumothorax.  No acute
osseous abnormality.  Surgical clips overlying the midline of the
left upper abdomen.
IMPRESSION: Mild bronchitic change without definite acute cardiopulmonary
disease.

## 2014-08-20 ENCOUNTER — Encounter: Payer: Self-pay | Admitting: Internal Medicine

## 2014-08-20 ENCOUNTER — Ambulatory Visit (INDEPENDENT_AMBULATORY_CARE_PROVIDER_SITE_OTHER): Payer: PRIVATE HEALTH INSURANCE | Admitting: Internal Medicine

## 2014-08-20 VITALS — BP 108/75 | HR 65 | Temp 98.1°F | Wt 113.4 lb

## 2014-08-20 DIAGNOSIS — N390 Urinary tract infection, site not specified: Secondary | ICD-10-CM

## 2014-08-20 DIAGNOSIS — Z905 Acquired absence of kidney: Secondary | ICD-10-CM

## 2014-08-20 DIAGNOSIS — R319 Hematuria, unspecified: Secondary | ICD-10-CM

## 2014-08-20 LAB — POCT URINALYSIS DIPSTICK
GLUCOSE UA: NEGATIVE
KETONES UA: NEGATIVE
Nitrite, UA: POSITIVE
PH UA: 6.5
Protein, UA: NEGATIVE
Urobilinogen, UA: 0.2

## 2014-08-20 MED ORDER — NITROFURANTOIN MONOHYD MACRO 100 MG PO CAPS: 100.0000 mg | ORAL_CAPSULE | Freq: Two times a day (BID) | ORAL | Status: DC

## 2014-08-20 NOTE — Progress Notes (Signed)
Pre visit review using our clinic review tool, if applicable. No additional management support is needed unless otherwise documented below in the visit note. 

## 2014-08-20 NOTE — Patient Instructions (Signed)
Get your blood work before you leave   Take lots of fluids and the antibiotic If the UTI symptoms increase or don't improve, let us know  You are due for a  physical exam.  REMINDERS  If we are ordering labs, XRs or referring you to a specialist : we will always communicate to you the results or the time of the appointment within few days. If you don't hear from us please call the office

## 2014-08-20 NOTE — Assessment & Plan Note (Signed)
Check a BMP

## 2014-08-20 NOTE — Progress Notes (Signed)
   Subjective:    Patient ID: Monica ShamsJennifer P Sweeney, female    DOB: 01/02/1961, 53 y.o.   MRN: [id number]  DOS:  08/20/2014 Type of visit - description : acute Interval history: Sx started ~ 3 days ago and urinary frequency and dysuria;  she did take Azo  OTC, symptoms are about the same. She has a UTI on average once a year Also she has a single kidney, no recent labs.   ROS Denies fever or chills No nausea vomiting No abdominal or flank pain  Past Medical History  Diagnosis Date  . Headache(784.0)   . Menopause     Past Surgical History  Procedure Laterality Date  . Kidney donation  02/16/2012  . Tonsillectomy    . Inguinal hernia repair      History   Social History  . Marital Status: Married    Spouse Name: N/A    Number of Children: 2  . Years of Education: N/A   Occupational History  . AnimatorAssistant general manager     GM at [employer]   Social History Main Topics  . Smoking status: Never Smoker   . Smokeless tobacco: Never Used  . Alcohol Use: Yes     Comment: occassional  . Drug Use: No  . Sexual Activity: Not on file   Other Topics Concern  . Not on file   Social History Narrative            Medication List       This list is accurate as of: 08/20/14  5:50 PM.  Always use your most recent med list.               AZO URINARY PAIN PO  Take 1 tablet by mouth as needed.     famotidine 20 MG tablet  Commonly known as:  PEPCID  One at bedtime     nitrofurantoin (macrocrystal-monohydrate) 100 MG capsule  Commonly known as:  MACROBID  Take 1 capsule (100 mg total) by mouth 2 (two) times daily.     pantoprazole 40 MG tablet  Commonly known as:  PROTONIX  TAKE 1 TABLET (40 MG TOTAL) BY MOUTH DAILY. TAKE 30-60 MIN BEFORE FIRST MEAL OF THE DAY           Objective:   Physical Exam BP 108/75  Pulse 65  Temp(Src) 98.1 F (36.7 C) (Oral)  Wt 113 lb 6 oz (51.427 kg)  SpO2 100%  General -- alert, well-developed, NAD.  HEENT-- Not  pale.  Abdomen-- Not distended, good bowel sounds,soft, non-tender. No CVA tenderness  Extremities-- no pretibial edema bilaterally  Neurologic--  alert & oriented X3. Speech normal, gait appropriate for age, strength symmetric and appropriate for age.     Psych-- Cognition and judgment appear intact. Cooperative with normal attention span and concentration. No anxious or depressed appearing.      Assessment & Plan:    Also she is due for a physical, encouraged to schedule

## 2014-08-20 NOTE — Assessment & Plan Note (Signed)
  UTI, Symptoms consistent with a UTI, Udip supportive Plan: Urine culture, empiric Macrobid.

## 2014-08-21 LAB — BASIC METABOLIC PANEL
BUN: 14 mg/dL (ref 6–23)
CHLORIDE: 104 meq/L (ref 96–112)
CO2: 28 meq/L (ref 19–32)
CREATININE: 0.8 mg/dL (ref 0.4–1.2)
Calcium: 10.7 mg/dL — ABNORMAL HIGH (ref 8.4–10.5)
GFR: 77.63 mL/min (ref >60.00)
GLUCOSE: 91 mg/dL (ref 70–99)
POTASSIUM: 4.3 meq/L (ref 3.5–5.1)
Sodium: 139 mEq/L (ref 135–145)

## 2014-08-23 LAB — URINE CULTURE: Colony Count: >=100000

## 2014-12-05 ENCOUNTER — Encounter: Payer: PRIVATE HEALTH INSURANCE | Admitting: Internal Medicine

## 2015-08-28 ENCOUNTER — Ambulatory Visit (INDEPENDENT_AMBULATORY_CARE_PROVIDER_SITE_OTHER): Payer: PRIVATE HEALTH INSURANCE | Admitting: Internal Medicine

## 2015-08-28 ENCOUNTER — Encounter: Payer: Self-pay | Admitting: Internal Medicine

## 2015-08-28 VITALS — BP 118/72 | HR 70 | Temp 98.7°F | Wt 110.0 lb

## 2015-08-28 DIAGNOSIS — B349 Viral infection, unspecified: Secondary | ICD-10-CM

## 2015-08-28 MED ORDER — AZITHROMYCIN 250 MG PO TABS: ORAL_TABLET | ORAL | Status: DC

## 2015-08-28 NOTE — Progress Notes (Signed)
   Subjective:    Patient ID: Monica Sweeney, female    DOB: 01/19/1961, 54 y.o.   MRN: [id number]  DOS:  08/28/2015 Type of visit - description : Acute visit Interval history: Symptoms started 2 or 3 weeks ago, felt like she had aches, sore throat. No fever or chills. Overall now better but has a persisting cough, sinus congestion and she is quite fatigued.   Review of Systems No watery eyes, some  SNEEZING. SOME NASAL DISCHARGE WHICH IS CLEAR. VERY LITTLE SPUTUM PRODUCTION, CLEAR.  Past Medical History  Diagnosis Date  . Headache(784.0)   . Menopause     Past Surgical History  Procedure Laterality Date  . Kidney donation  02/16/2012  . Tonsillectomy    . Inguinal hernia repair      Social History   Social History  . Marital Status: Married    Spouse Name: N/A  . Number of Children: 2  . Years of Education: N/A   Occupational History  . AnimatorAssistant general manager     GM at [employer]   Social History Main Topics  . Smoking status: Never Smoker   . Smokeless tobacco: Never Used  . Alcohol Use: Yes     Comment: occassional  . Drug Use: No  . Sexual Activity: Not on file   Other Topics Concern  . Not on file   Social History Narrative            Medication List       This list is accurate as of: 08/28/15 11:59 PM.  Always use your most recent med list.               azithromycin 250 MG tablet  Commonly known as:  ZITHROMAX Z-PAK  2 tabs a day the first day, then 1 tab a day x 4 days           Objective:   Physical Exam BP 118/72 mmHg  Pulse 70  Temp(Src) 98.7 F (37.1 C) (Oral)  Wt 110 lb (49.896 kg)  SpO2 96% General:   Well developed, well nourished . NAD.  HEENT:  Normocephalic . Face symmetric, atraumatic. TMs normal, nose slightly congested, sinuses no TTP Lungs:  CTA B Normal respiratory effort, no intercostal retractions, no accessory muscle use. Heart: RRR,  no murmur.  No pretibial edema bilaterally  Skin:  Not pale. Not jaundice Neurologic:  alert & oriented X3.  Speech normal, gait appropriate for age and unassisted Psych--  Cognition and judgment appear intact.  Cooperative with normal attention span and concentration.  Behavior appropriate. No anxious or depressed appearing.      Assessment & Plan:   Viral syndrome, Had a viral syndrome 3 weeks ago now has persisting cough and sinus congestion. Will attempt conservative treatment first, if not better will get Zithromax. If not better will call. See instructions. Recommend a flu shot once she is better

## 2015-08-28 NOTE — Progress Notes (Signed)
Pre visit review using our clinic review tool, if applicable. No additional management support is needed unless otherwise documented below in the visit note. 

## 2015-08-28 NOTE — Patient Instructions (Signed)
Rest, fluids , tylenol  For cough: Take Mucinex DM twice a day as needed until better  For nasal congestion Use OTC  Flonase : 2 nasal sprays on each side of the nose daily until you feel better    Take the antibiotic as prescribed, Zithromax, only if not better in the next 3 or 4 days   Call if not gradually better over the next  10 days  Call anytime if the symptoms are severe

## 2016-05-12 ENCOUNTER — Ambulatory Visit (INDEPENDENT_AMBULATORY_CARE_PROVIDER_SITE_OTHER): Payer: PRIVATE HEALTH INSURANCE | Admitting: Internal Medicine

## 2016-05-12 ENCOUNTER — Encounter: Payer: Self-pay | Admitting: Internal Medicine

## 2016-05-12 VITALS — BP 108/66 | HR 67 | Temp 98.2°F | Ht 64.0 in | Wt 110.5 lb

## 2016-05-12 DIAGNOSIS — H532 Diplopia: Secondary | ICD-10-CM

## 2016-05-12 DIAGNOSIS — Z23 Encounter for immunization: Secondary | ICD-10-CM

## 2016-05-12 DIAGNOSIS — Z01419 Encounter for gynecological examination (general) (routine) without abnormal findings: Secondary | ICD-10-CM

## 2016-05-12 DIAGNOSIS — Z1231 Encounter for screening mammogram for malignant neoplasm of breast: Secondary | ICD-10-CM

## 2016-05-12 DIAGNOSIS — Z0000 Encounter for general adult medical examination without abnormal findings: Secondary | ICD-10-CM | POA: Diagnosis not present

## 2016-05-12 NOTE — Progress Notes (Signed)
Subjective:    Patient ID: Monica Sweeney, female    DOB: 03/25/1961, 55 y.o.   MRN: [id number]  DOS:  05/12/2016 Type of visit - description : CPX Interval history:  Has few concerns, see review of systems  Review of Systems Constitutional: No fever. No chills. No unexplained wt changes. No unusual sweats. Reports she is very tired, no anxiety, chest pain, shortness of breath or snoring. Admits to working very long hours lately.  HEENT: No dental problems, no ear discharge, no facial swelling, no voice changes. No eye discharge, no eye  redness , no  intolerance to light  . She did report blurred and double vision for the last 3 months, usually after a few minutes or reading of her computer or cell phone. No headache, nausea, dysphagia.   Respiratory: No wheezing , no  difficulty breathing. No cough , no mucus production  Cardiovascular: No CP, no leg swelling , no  Palpitations  GI: no nausea, no vomiting, no diarrhea , no  abdominal pain.  No blood in the stools. No dysphagia, no odynophagia    Endocrine: No polyphagia, no polyuria , no polydipsia  GU: No dysuria, gross hematuria, difficulty urinating. No urinary urgency, no frequency.  Musculoskeletal: No joint swellings or unusual aches or pains. Right bunion and right mid foot pain on and off for a while  Skin: No change in the color of the skin, palor , no  Rash  Allergic, immunologic: No environmental allergies , no  food allergies  Neurological: No dizziness no  syncope. No headaches. No diplopia, no slurred, no slurred speech, no motor deficits, no facial  Numbness  Hematological: No enlarged lymph nodes, no easy bruising , no unusual bleedings  Psychiatry: No suicidal ideas, no hallucinations, no beavior problems, no confusion.  No unusual/severe anxiety, no depression   Past Medical History  Diagnosis Date  . Headache(784.0)   . Menopause     Past Surgical History  Procedure Laterality Date  . Kidney  donation  02/16/2012  . Tonsillectomy    . Inguinal hernia repair      Social History   Social History  . Marital Status: Married    Spouse Name: N/A  . Number of Children: 2  . Years of Education: N/A   Occupational History  . AnimatorAssistant general manager     GM at [employer]   Social History Main Topics  . Smoking status: Never Smoker   . Smokeless tobacco: Never Used  . Alcohol Use: Yes     Comment: occassional  . Drug Use: No  . Sexual Activity: Not on file   Other Topics Concern  . Not on file   Social History Narrative   2 children, independent       Family History  Problem Relation Age of Onset  . Hypertension Mother   . Liver cancer Maternal Grandmother   . Colon cancer Neg Hx   . Breast cancer Neg Hx   . CAD Neg Hx        Medication List    Notice  As of 05/12/2016 11:59 PM   You have not been prescribed any medications.         Objective:   Physical Exam BP 108/66 mmHg  Pulse 67  Temp(Src) 98.2 F (36.8 C) (Oral)  Ht 5\' 4"  (1.626 m)  Wt 110 lb 8 oz (50.122 kg)  BMI 18.96 kg/m2  SpO2 98%  General:   Well developed, well  nourished . NAD.  Neck: No  thyromegaly . Normal carotid pulses HEENT:  Normocephalic . Face symmetric, atraumatic. EOMI, pupils equal and reactive. Lungs:  CTA B Normal respiratory effort, no intercostal retractions, no accessory muscle use. Heart: RRR,  no murmur.  No pretibial edema bilaterally  Abdomen:  Not distended, soft, non-tender. No rebound or rigidity.  . MSK: + Right-sided bunion without redness or swelling. R midfoot no TTP. Skin: Exposed areas without rash. Not pale. Not jaundice Neurologic:  alert & oriented X3.  Speech normal, gait appropriate for age and unassisted Strength symmetric and appropriate for age. DTRs symmetric Psych: Cognition and judgment appear intact.  Cooperative with normal attention span and concentration.  Behavior appropriate. No anxious or depressed appearing.     Assessment & Plan:   Assessment Menopausal  Headaches Kidney donation 2013  Plan: Diplopia: As described above, for the last 3 months, eye exam negative. Will refer to ophthalmology, depending on results  may need an MRI or a neuro referral. Right mid foot pain-: Metatarsalgia? Rx  a metatarsal pad, if not better refer to sports medicine Right-sided bunion: If interested she could be refer to a surgeon Very fatigue: Check labs RTC 6 months

## 2016-05-12 NOTE — Patient Instructions (Signed)
GO TO THE LAB : Get the blood work     GO TO THE FRONT DESK Schedule your next appointment for a routine checkup in 6 months    Use a metatarsal pad on both sides, use it  gradually. If not better let me know

## 2016-05-12 NOTE — Progress Notes (Signed)
Pre visit review using our clinic review tool, if applicable. No additional management support is needed unless otherwise documented below in the visit note. 

## 2016-05-12 NOTE — Assessment & Plan Note (Addendum)
Td 2010 ;  Single kidney, recommend a pnm 23- today prevnar : will discuss next year  CCS: Cscope 2013 neg, normal, 55 years Female care : no recent PAP-MMG, refer to gyn Dr Edward JollySilva   Diet-exercise discussed

## 2016-05-13 DIAGNOSIS — Z09 Encounter for follow-up examination after completed treatment for conditions other than malignant neoplasm: Secondary | ICD-10-CM | POA: Insufficient documentation

## 2016-05-13 LAB — COMPREHENSIVE METABOLIC PANEL
ALK PHOS: 77 U/L (ref 39–117)
ALT: 19 U/L (ref 0–35)
AST: 20 U/L (ref 0–37)
Albumin: 4.5 g/dL (ref 3.5–5.2)
BUN: 19 mg/dL (ref 6–23)
CHLORIDE: 104 meq/L (ref 96–112)
CO2: 30 meq/L (ref 19–32)
Calcium: 11 mg/dL — ABNORMAL HIGH (ref 8.4–10.5)
Creatinine, Ser: 0.83 mg/dL (ref 0.40–1.20)
GFR: 76.05 mL/min (ref >60.00)
GLUCOSE: 92 mg/dL (ref 70–99)
POTASSIUM: 4 meq/L (ref 3.5–5.1)
Sodium: 140 mEq/L (ref 135–145)
TOTAL PROTEIN: 7.1 g/dL (ref 6.0–8.3)
Total Bilirubin: 0.4 mg/dL (ref 0.2–1.2)

## 2016-05-13 LAB — CBC WITH DIFFERENTIAL/PLATELET
BASOS PCT: 0.5 % (ref 0.0–3.0)
Basophils Absolute: 0 10*3/uL (ref 0.0–0.1)
EOS PCT: 4.5 % (ref 0.0–5.0)
Eosinophils Absolute: 0.3 10*3/uL (ref 0.0–0.7)
HCT: 39.2 % (ref 36.0–46.0)
Hemoglobin: 13.1 g/dL (ref 12.0–15.0)
LYMPHS ABS: 1.8 10*3/uL (ref 0.7–4.0)
Lymphocytes Relative: 30 % (ref 12.0–46.0)
MCHC: 33.5 g/dL (ref 30.0–36.0)
MCV: 90.4 fl (ref 78.0–100.0)
MONOS PCT: 8 % (ref 3.0–12.0)
Monocytes Absolute: 0.5 10*3/uL (ref 0.1–1.0)
NEUTROS ABS: 3.4 10*3/uL (ref 1.4–7.7)
NEUTROS PCT: 57 % (ref 43.0–77.0)
Platelets: 248 10*3/uL (ref 150.0–400.0)
RBC: 4.33 Mil/uL (ref 3.87–5.11)
RDW: 13.3 % (ref 11.5–15.5)
WBC: 5.9 10*3/uL (ref 4.0–10.5)

## 2016-05-13 LAB — LIPID PANEL
CHOLESTEROL: 210 mg/dL — AB (ref 0–200)
HDL: 82.2 mg/dL (ref >39.00)
LDL Cholesterol: 105 mg/dL — ABNORMAL HIGH (ref 0–99)
NONHDL: 128.05
TRIGLYCERIDES: 113 mg/dL (ref 0.0–149.0)
Total CHOL/HDL Ratio: 3
VLDL: 22.6 mg/dL (ref 0.0–40.0)

## 2016-05-13 LAB — TSH: TSH: 0.86 u[IU]/mL (ref 0.35–4.50)

## 2016-05-13 LAB — VITAMIN B12: VITAMIN B 12: 335 pg/mL (ref 211–911)

## 2016-05-13 LAB — FOLATE: FOLATE: 12.5 ng/mL (ref >5.9)

## 2016-05-13 NOTE — Assessment & Plan Note (Signed)
Diplopia: As described above, for the last 3 months, eye exam negative. Will refer to ophthalmology, depending on results  may need an MRI or a neuro referral. Right mid foot pain-: Metatarsalgia? Rx  a metatarsal pad, if not better refer to sports medicine Right-sided bunion: If interested she could be refer to a surgeon Very fatigue: Check labs RTC 6 months

## 2016-05-14 LAB — VITAMIN D 1,25 DIHYDROXY
VITAMIN D3 1, 25 (OH): 42 pg/mL
Vitamin D 1, 25 (OH)2 Total: 42 pg/mL (ref 18–72)

## 2016-05-14 NOTE — Addendum Note (Signed)
Addended byConrad Flora: Georgie Haque D on: 05/14/2016 02:56 PM   Modules accepted: Orders, SmartSet

## 2016-05-20 ENCOUNTER — Other Ambulatory Visit (INDEPENDENT_AMBULATORY_CARE_PROVIDER_SITE_OTHER): Payer: PRIVATE HEALTH INSURANCE

## 2016-05-20 ENCOUNTER — Telehealth (INDEPENDENT_AMBULATORY_CARE_PROVIDER_SITE_OTHER): Payer: PRIVATE HEALTH INSURANCE | Admitting: *Deleted

## 2016-05-20 DIAGNOSIS — R3915 Urgency of urination: Secondary | ICD-10-CM

## 2016-05-20 DIAGNOSIS — R350 Frequency of micturition: Secondary | ICD-10-CM

## 2016-05-20 DIAGNOSIS — R300 Dysuria: Secondary | ICD-10-CM

## 2016-05-20 LAB — POC URINALSYSI DIPSTICK (AUTOMATED)
BILIRUBIN UA: NEGATIVE
Glucose, UA: NEGATIVE
KETONES UA: NEGATIVE
Nitrite, UA: NEGATIVE
PH UA: 7
PROTEIN UA: NEGATIVE
SPEC GRAV UA: 1.02
Urobilinogen, UA: 0.2

## 2016-05-20 NOTE — Telephone Encounter (Signed)
Pt came in today for blood work.   While here the pt stated that she was a patient of Dr. Drue NovelPaz and she have been having UTI symptoms.  She said she have had pain while urinating,urgency, and frequent since Monday.  She stated that she knew she was coming in here for blood work today, so she decided to wait and speak to someone today about it.   She said she didn't know if she will need to be seen or she could just give a urine.  Pt also said she has been using AZO since Monday.  Informed the pt that Dr. Drue NovelPaz was not in the office and I could speak with Ramon DredgeEdward Saguier-PA-C(who is covering for Dr. Drue NovelPaz today) to see what he would like for her to do.  Pt verbalized understanding and agreed.  Verbally spoke with Ramon DredgeEdward and informed him of the information that the pt had given to me, and asked if the pt will need to be seen or just collect a urine.  Per Ramon DredgeEdward have the pt to give a urine for a poct UA and UC.  Urine collected and poct UA ran and shown to [place].  Per Ramon DredgeEdward will send for UC and if the pt has not heard from us by Friday ask the pt to give us a call.  Informed the pt of verbal message.  Pt verbalized understanding and agreed.  Labs ordered and sent.//AB/CMA

## 2016-05-20 NOTE — Addendum Note (Signed)
Addended by: Verdie ShireBAYNES, Akirra Lacerda M on: 05/20/2016 04:21 PM   Modules accepted: Orders

## 2016-05-21 ENCOUNTER — Encounter: Payer: Self-pay | Admitting: Family

## 2016-05-21 LAB — PTH, INTACT AND CALCIUM
Calcium: 10.5 mg/dL (ref 8.4–10.5)
PTH: 58 pg/mL (ref 14–64)

## 2016-05-21 MED ORDER — CEPHALEXIN 500 MG PO CAPS: 500.0000 mg | ORAL_CAPSULE | Freq: Two times a day (BID) | ORAL | Status: DC

## 2016-05-21 NOTE — Telephone Encounter (Signed)
Kelflex sent to her pharmacy.

## 2016-05-22 LAB — URINE CULTURE

## 2016-05-22 NOTE — Progress Notes (Signed)
Quick Note:  Pt has seen results on MyChart and message also sent for patient to call back if any questions. ______ 

## 2016-06-02 ENCOUNTER — Telehealth: Payer: Self-pay | Admitting: Obstetrics and Gynecology

## 2016-06-02 NOTE — Telephone Encounter (Signed)
Called and left a message for patient to call back to schedule a new patient doctor referral. °

## 2016-06-10 ENCOUNTER — Ambulatory Visit: Payer: Self-pay | Admitting: Obstetrics and Gynecology

## 2016-06-16 NOTE — Telephone Encounter (Signed)
Called and left a message for patient to call back to schedule a new patient doctor referral. °

## 2016-06-30 NOTE — Telephone Encounter (Signed)
Called and left a message for patient to call back to schedule a new patient doctor referral. °

## 2016-07-01 ENCOUNTER — Ambulatory Visit: Payer: Self-pay | Admitting: Obstetrics and Gynecology

## 2016-07-06 ENCOUNTER — Telehealth: Payer: Self-pay | Admitting: Obstetrics and Gynecology

## 2016-07-06 NOTE — Telephone Encounter (Signed)
Called patient to reschedule her appointment for a new patient doctor referral but could not leave a message due to "Mailbox full."

## 2016-07-08 NOTE — Telephone Encounter (Signed)
Called and left a message for patient to call back to schedule a new patient doctor referral. °

## 2016-07-14 NOTE — Telephone Encounter (Signed)
Called and left a message for patient to call back to schedule a new patient doctor referral. °

## 2016-07-16 NOTE — Telephone Encounter (Signed)
Note to referring office: Please re-refer this patient when she is ready to schedule and keep an appointment with our office. She has cancelled two scheduled appointments and is not returning my calls to reschedule.

## 2016-11-18 ENCOUNTER — Ambulatory Visit: Payer: PRIVATE HEALTH INSURANCE | Admitting: Internal Medicine

## 2017-06-04 ENCOUNTER — Other Ambulatory Visit: Payer: Self-pay | Admitting: Family Medicine

## 2017-11-30 ENCOUNTER — Telehealth: Payer: Self-pay | Admitting: *Deleted

## 2017-11-30 NOTE — Telephone Encounter (Signed)
[facility] forwarded to provider/[facility] 01/15

## 2018-01-18 ENCOUNTER — Encounter: Payer: Self-pay | Admitting: Internal Medicine

## 2018-01-18 ENCOUNTER — Ambulatory Visit: Payer: PRIVATE HEALTH INSURANCE | Admitting: Internal Medicine

## 2018-01-18 VITALS — BP 116/64 | HR 70 | Temp 97.9°F | Resp 14 | Ht 64.0 in | Wt 114.1 lb

## 2018-01-18 DIAGNOSIS — Z01419 Encounter for gynecological examination (general) (routine) without abnormal findings: Secondary | ICD-10-CM

## 2018-01-18 DIAGNOSIS — R42 Dizziness and giddiness: Secondary | ICD-10-CM

## 2018-01-18 DIAGNOSIS — Z1231 Encounter for screening mammogram for malignant neoplasm of breast: Secondary | ICD-10-CM

## 2018-01-18 MED ORDER — MECLIZINE HCL 12.5 MG PO TABS: 12.5000 mg | ORAL_TABLET | Freq: Three times a day (TID) | ORAL | 0 refills | Status: DC | PRN

## 2018-01-18 NOTE — Progress Notes (Signed)
Pre visit review using our clinic review tool, if applicable. No additional management support is needed unless otherwise documented below in the visit note. 

## 2018-01-18 NOTE — Progress Notes (Signed)
Subjective:    Patient ID: Monica Sweeney, female    DOB: [birth year], 57 y.o.   MRN: [id number]  DOS:  01/18/2018 Type of visit - description : Acute visit Interval history: Chief complaint is dizziness x 4-5 days.   Dizzinessdescribed as spinning when she rolls in bed, looks quickly to the sides, bend down or look up   Symptoms last a few seconds and then symptoms decreased substantially. When she is not moving, she has very mild but persistent "umsteady feeling". Occasionally had some nausea. Denies any other symptoms  Review of Systems  No fever, chills or recent URI No chest pain, difficulty breathing, edema or palpitations No blood in the stools or vaginal bleeding No headache, slurred speech.  She does have chronic diplopia particularly when she is tired, has seen her ophthalmologist for that before, symptoms are currently at baseline and very mild.  Past Medical History:  Diagnosis Date  . Headache(784.0)   . Menopause     Past Surgical History:  Procedure Laterality Date  . INGUINAL HERNIA REPAIR    . 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 TONSILLECTOMY      Social History   Socioeconomic History  . Marital status: Married    Spouse name: Not on file  . Number of children: 2  . Years of education: Not on file  . Highest education level: Not on file  Social Needs  . Financial resource strain: Not on file  . Food insecurity - worry: Not on file  . Food insecurity - inability: Not on file  . Transportation needs - medical: Not on file  . Transportation needs - non-medical: Not on file  Occupational History  . Occupation: Chief Financial Officer: [employer]    Comment: GM at [employer]  Tobacco Use  . Smoking status: Never Smoker  . Smokeless tobacco: Never Used  Substance and Sexual Activity  . Alcohol use: Yes    Comment: occassional  . Drug use: No  . Sexual activity: Not on file  Other Topics Concern  . Not on file    Social History Narrative   2 children, independent        Allergies as of 01/18/2018   No Known Allergies     Medication List        Accurate as of 01/18/18 11:59 PM. Always use your most recent med list.          meclizine 12.5 MG tablet Commonly known as:  ANTIVERT Take 1-2 tablets (12.5-25 mg total) by mouth 3 (three) times daily as needed for dizziness.          Objective:   Physical Exam BP 116/64 (BP Location: Left Arm, Patient Position: Sitting, Cuff Size: Small)   Pulse 70   Temp 97.9 F (36.6 C) (Oral)   Resp 14   Ht 5\' 4"  (1.626 m)   Wt 114 lb 2 oz (51.8 kg)   SpO2 96%   BMI 19.59 kg/m  General:   Well developed, well nourished . NAD.  HEENT:  Normocephalic . Face symmetric, atraumatic. Neck: No thyromegaly, normal carotid pulses Lungs:  CTA B Normal respiratory effort, no intercostal retractions, no accessory muscle use. Heart: RRR,  no murmur.  No pretibial edema bilaterally  Skin: Not pale. Not jaundice Neurologic:  alert & oriented X3.  Speech normal, gait appropriate for age and unassisted EOMI, pupils equal and reactive, DTRs and motor symmetric.  Face symmetric.  Psych--  Cognition and judgment appear intact.  Cooperative with normal attention span and concentration.  Behavior appropriate. No anxious or depressed appearing.      Assessment & Plan:   Assessment Menopausal  Headaches Kidney donation 2013  Plan: Dizziness : As described above, likely a peripheral issue, neurological exam is normal, ROS negative except for chronic diplopia which is at baseline.  Recommend rest, fluids, meclizine, observation.  If not gradually better or if symptoms change or are persistent she would let me know.  See AVS.  For completeness we will get general labs including a CMP, CBC and TSH preventive care: Has not seen gynecology in a while, will refer.  Also recommend to RTC here at her convenience for a CPX.

## 2018-01-18 NOTE — Patient Instructions (Addendum)
GO TO THE LAB : Get the blood work     GO TO THE FRONT DESK Schedule your next appointment for a physical exam with me in few months   Dizziness:  Rest Drink plenty of fluids Take meclizine as needed, may cause drowsiness Call if you have severe or persistent dizziness or if you are not gradually improving the next few days ER if you have any stroke like symptoms   We are referring you to gynecology Dr. Edward JollySilva

## 2018-01-19 ENCOUNTER — Telehealth: Payer: Self-pay | Admitting: Obstetrics and Gynecology

## 2018-01-19 LAB — CBC WITH DIFFERENTIAL/PLATELET
BASOS PCT: 0.8 % (ref 0.0–3.0)
Basophils Absolute: 0.1 10*3/uL (ref 0.0–0.1)
EOS ABS: 0.1 10*3/uL (ref 0.0–0.7)
EOS PCT: 1.2 % (ref 0.0–5.0)
HCT: 40.6 % (ref 36.0–46.0)
Hemoglobin: 13.7 g/dL (ref 12.0–15.0)
Lymphocytes Relative: 23.3 % (ref 12.0–46.0)
Lymphs Abs: 1.8 10*3/uL (ref 0.7–4.0)
MCHC: 33.7 g/dL (ref 30.0–36.0)
MCV: 91.7 fl (ref 78.0–100.0)
MONO ABS: 0.5 10*3/uL (ref 0.1–1.0)
Monocytes Relative: 7.1 % (ref 3.0–12.0)
NEUTROS ABS: 5.2 10*3/uL (ref 1.4–7.7)
Neutrophils Relative %: 67.6 % (ref 43.0–77.0)
PLATELETS: 240 10*3/uL (ref 150.0–400.0)
RBC: 4.43 Mil/uL (ref 3.87–5.11)
RDW: 12.9 % (ref 11.5–15.5)
WBC: 7.6 10*3/uL (ref 4.0–10.5)

## 2018-01-19 LAB — COMPREHENSIVE METABOLIC PANEL
ALT: 16 U/L (ref 0–35)
AST: 17 U/L (ref 0–37)
Albumin: 4.4 g/dL (ref 3.5–5.2)
Alkaline Phosphatase: 70 U/L (ref 39–117)
BILIRUBIN TOTAL: 0.4 mg/dL (ref 0.2–1.2)
BUN: 19 mg/dL (ref 6–23)
CO2: 29 meq/L (ref 19–32)
CREATININE: 0.96 mg/dL (ref 0.40–1.20)
Calcium: 11.1 mg/dL — ABNORMAL HIGH (ref 8.4–10.5)
Chloride: 105 mEq/L (ref 96–112)
GFR: 63.9 mL/min (ref >60.00)
GLUCOSE: 95 mg/dL (ref 70–99)
Potassium: 4.3 mEq/L (ref 3.5–5.1)
SODIUM: 141 meq/L (ref 135–145)
Total Protein: 7.1 g/dL (ref 6.0–8.3)

## 2018-01-19 LAB — TSH: TSH: 1.2 u[IU]/mL (ref 0.35–4.50)

## 2018-01-19 NOTE — Assessment & Plan Note (Signed)
Dizziness : As described above, likely a peripheral issue, neurological exam is normal, ROS negative except for chronic diplopia which is at baseline.  Recommend rest, fluids, meclizine, observation.  If not gradually better or if symptoms change or are persistent she would let me know.  See AVS.  For completeness we will get general labs including a CMP, CBC and TSH preventive care: Has not seen gynecology in a while, will refer.  Also recommend to RTC here at her convenience for a CPX.

## 2018-01-19 NOTE — Telephone Encounter (Signed)
Called and left a message for patient to call back to schedule a new patient doctor referral appointment with our office for an AEX. °

## 2018-01-24 NOTE — Telephone Encounter (Signed)
Called and left a message for patient to call back to schedule a new patient doctor referral appointment with our office for an AEX. °

## 2018-01-26 NOTE — Telephone Encounter (Signed)
Called and left a message for patient to call back to schedule a new patient doctor referral appointment with our office for an AEX. °

## 2018-03-16 ENCOUNTER — Telehealth: Payer: Self-pay | Admitting: Internal Medicine

## 2018-03-16 NOTE — Telephone Encounter (Signed)
Patient called, left VM to return the call to the office if she has questions about her lab work.

## 2018-03-16 NOTE — Telephone Encounter (Signed)
Copied from CRM [id number]. Topic: Quick Communication - See Telephone Encounter >> Mar 16, 2018 11:26 AM Diana Eves B wrote: CRM for notification. See Telephone encounter for: 03/16/18.  Pt was reviewing her labs from 01/18/18 and noticed some where elevated and had some questions regarding those. CB# [phone]

## 2019-06-12 ENCOUNTER — Encounter: Payer: Self-pay | Admitting: Internal Medicine

## 2019-09-26 ENCOUNTER — Other Ambulatory Visit: Payer: Self-pay

## 2019-09-26 DIAGNOSIS — Z20822 Contact with and (suspected) exposure to covid-19: Secondary | ICD-10-CM

## 2019-09-28 LAB — NOVEL CORONAVIRUS, NAA: SARS-CoV-2, NAA: NOT DETECTED

## 2020-09-30 ENCOUNTER — Ambulatory Visit: Payer: PRIVATE HEALTH INSURANCE | Admitting: Physician Assistant

## 2021-01-06 ENCOUNTER — Encounter: Payer: Self-pay | Admitting: Physician Assistant

## 2021-01-06 ENCOUNTER — Other Ambulatory Visit: Payer: Self-pay

## 2021-01-06 ENCOUNTER — Ambulatory Visit (INDEPENDENT_AMBULATORY_CARE_PROVIDER_SITE_OTHER): Payer: PRIVATE HEALTH INSURANCE | Admitting: Physician Assistant

## 2021-01-06 VITALS — BP 101/68 | HR 74 | Temp 100.0°F | Ht 64.0 in | Wt 114.1 lb

## 2021-01-06 DIAGNOSIS — Z0000 Encounter for general adult medical examination without abnormal findings: Secondary | ICD-10-CM

## 2021-01-06 DIAGNOSIS — Z7689 Persons encountering health services in other specified circumstances: Secondary | ICD-10-CM

## 2021-01-06 DIAGNOSIS — Z809 Family history of malignant neoplasm, unspecified: Secondary | ICD-10-CM | POA: Diagnosis not present

## 2021-01-06 NOTE — Progress Notes (Signed)
New Patient Office Visit  Subjective:  Patient ID: [name], female    DOB: 1961/11/15  Age: 60 y.o. MRN: [id number]  CC:  Chief Complaint  Patient presents with  . New Patient (Initial Visit)    HPI Monica Sweeney presents to establish care. Has no acute concerns. Reports has not been to the doctor in a while. Denies medical history of diabetes mellitus, hypertension, heart diease, hyperlipidemia or thyroid disease. Patient is not taking OTC or prescribed medications. 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 Family history of pertinent for cancer, possibly liver, patient not sure. Patient's mother passed away 2 years ago from dementia/ Alzheimer. Patient has never been a smoker, drinks 1 glass of wine/day or 6 per week, and no substance use. Drinks 1-2 cups of caffeine/day. Stays active with circuit training x 30 minutes 4-5 times/wk.     Past Medical History:  Diagnosis Date  . Headache(784.0)   . Menopause     Past Surgical History:  Procedure Laterality Date  . INGUINAL HERNIA REPAIR    . 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 TONSILLECTOMY      Family History  Problem Relation Age of Onset  . Hypertension Mother   . Liver cancer Maternal Grandmother   . Colon cancer Neg Hx   . Breast cancer Neg Hx   . CAD Neg Hx     Social History   Socioeconomic History  . Marital status: Married    Spouse name: Monica Sweeney  . Number of children: 2  . Years of education: diploma  . Highest education level: Associate degree: occupational, Scientist, product/process development, or vocational program  Occupational History  . Occupation: Chief Financial Officer: [employer]    Comment: GM at [employer]  Tobacco Use  . Smoking status: Never Smoker  . Smokeless tobacco: Never Used  Substance and Sexual Activity  . Alcohol use: Yes    Alcohol/week: 6.0 standard drinks    Types: 6 Glasses of wine per week    Comment: occassional  .  Drug use: No  . Sexual activity: Not Currently  Other Topics Concern  . Not on file  Social History Narrative   2 children, independent     Social Determinants of Health   Financial Resource Strain: Not on file  Food Insecurity: Not on file  Transportation Needs: Not on file  Physical Activity: Not on file  Stress: Not on file  Social Connections: Not on file  Intimate Partner Violence: Not on file    ROS Review of Systems A fourteen system review of systems was performed and found to be positive as per HPI.  Objective:   Today's Vitals: BP 101/68   Pulse 74   Temp 100 F (37.8 C)   Ht 5\' 4"  (1.626 m)   Wt 114 lb 1.6 oz (51.8 kg)   SpO2 97%   BMI 19.59 kg/m   Physical Exam General:  Well Developed, well nourished, appropriate for stated age.  Neuro:  Alert and oriented,  extra-ocular muscles intact  HEENT:  Normocephalic, atraumatic, neck supple, no carotid bruits appreciated  Skin:  no gross rash, warm, pink. Cardiac:  RRR, S1 S2 wnl's w/o murmur  Respiratory:  ECTA B/L w/o wheezing, crackles or rales, Not using accessory muscles, speaking in full sentences- unlabored. Vascular:  Ext warm, no cyanosis apprec.; cap RF less 2 sec. No gross edema Psych:  No HI/SI, judgement and insight good,  Euthymic mood. Full Affect.   Assessment & Plan:   Problem List Items Addressed This Visit   None   Visit Diagnoses    Encounter to establish care    -  Primary   Family history of cancer         Healthcare Maintenance: -Schedule CPE and FBW. -Continue physical activity regimen. -Recommend to stay well hydrated and follow a heart healthy diet such as Mediterranean.  -Continue to abstain from tobacco use and low alcohol use.   Outpatient Encounter Medications as of 01/06/2021  Medication Sig  . meclizine (ANTIVERT) 12.5 MG tablet Take 1-2 tablets (12.5-25 mg total) by mouth 3 (three) times daily as needed for dizziness. (Patient not taking: Reported on 01/06/2021)   No  facility-administered encounter medications on file as of 01/06/2021.    Follow-up: Return for CPE and FBW 1 wk prior in 3-4 months.   Mayer Masker, PA-C

## 2021-03-31 ENCOUNTER — Other Ambulatory Visit: Payer: Self-pay | Admitting: Physician Assistant

## 2021-03-31 DIAGNOSIS — Z0000 Encounter for general adult medical examination without abnormal findings: Secondary | ICD-10-CM

## 2021-04-01 ENCOUNTER — Other Ambulatory Visit: Payer: Self-pay

## 2021-04-01 ENCOUNTER — Other Ambulatory Visit: Payer: PRIVATE HEALTH INSURANCE

## 2021-04-01 DIAGNOSIS — Z0000 Encounter for general adult medical examination without abnormal findings: Secondary | ICD-10-CM

## 2021-04-02 LAB — CBC
Hematocrit: 39.1 % (ref 34.0–46.6)
Hemoglobin: 13.3 g/dL (ref 11.1–15.9)
MCH: 30.9 pg (ref 26.6–33.0)
MCHC: 34 g/dL (ref 31.5–35.7)
MCV: 91 fL (ref 79–97)
Platelets: 223 10*3/uL (ref 150–450)
RBC: 4.31 x10E6/uL (ref 3.77–5.28)
RDW: 12 % (ref 11.7–15.4)
WBC: 4.1 10*3/uL (ref 3.4–10.8)

## 2021-04-02 LAB — COMPREHENSIVE METABOLIC PANEL
ALT: 15 IU/L (ref 0–32)
AST: 17 IU/L (ref 0–40)
Albumin/Globulin Ratio: 1.9 (ref 1.2–2.2)
Albumin: 4.6 g/dL (ref 3.8–4.9)
Alkaline Phosphatase: 78 IU/L (ref 44–121)
BUN/Creatinine Ratio: 24 — ABNORMAL HIGH (ref 9–23)
BUN: 18 mg/dL (ref 6–24)
Bilirubin Total: 0.4 mg/dL (ref 0.0–1.2)
CO2: 22 mmol/L (ref 20–29)
Calcium: 10.6 mg/dL — ABNORMAL HIGH (ref 8.7–10.2)
Chloride: 102 mmol/L (ref 96–106)
Creatinine, Ser: 0.74 mg/dL (ref 0.57–1.00)
Globulin, Total: 2.4 g/dL (ref 1.5–4.5)
Glucose: 101 mg/dL — ABNORMAL HIGH (ref 65–99)
Potassium: 4.5 mmol/L (ref 3.5–5.2)
Sodium: 141 mmol/L (ref 134–144)
Total Protein: 7 g/dL (ref 6.0–8.5)
eGFR: 93 mL/min/{1.73_m2} (ref >59)

## 2021-04-02 LAB — TSH: TSH: 1.48 u[IU]/mL (ref 0.450–4.500)

## 2021-04-02 LAB — LIPID PANEL
Chol/HDL Ratio: 2.3 ratio (ref 0.0–4.4)
Cholesterol, Total: 201 mg/dL — ABNORMAL HIGH (ref 100–199)
HDL: 87 mg/dL (ref >39)
LDL Chol Calc (NIH): 102 mg/dL — ABNORMAL HIGH (ref 0–99)
Triglycerides: 66 mg/dL (ref 0–149)
VLDL Cholesterol Cal: 12 mg/dL (ref 5–40)

## 2021-04-02 LAB — HEMOGLOBIN A1C
Est. average glucose Bld gHb Est-mCnc: 108 mg/dL
Hgb A1c MFr Bld: 5.4 % (ref 4.8–5.6)

## 2021-04-09 ENCOUNTER — Other Ambulatory Visit: Payer: Self-pay

## 2021-04-09 ENCOUNTER — Encounter: Payer: Self-pay | Admitting: Physician Assistant

## 2021-04-09 ENCOUNTER — Ambulatory Visit (INDEPENDENT_AMBULATORY_CARE_PROVIDER_SITE_OTHER): Payer: PRIVATE HEALTH INSURANCE | Admitting: Physician Assistant

## 2021-04-09 VITALS — BP 99/67 | HR 76 | Temp 97.4°F | Ht 64.0 in | Wt 113.5 lb

## 2021-04-09 DIAGNOSIS — Z0000 Encounter for general adult medical examination without abnormal findings: Secondary | ICD-10-CM

## 2021-04-09 DIAGNOSIS — Z23 Encounter for immunization: Secondary | ICD-10-CM | POA: Diagnosis not present

## 2021-04-09 NOTE — Progress Notes (Signed)
Female Physical   Impression and Recommendations:    1. Healthcare maintenance   2. Need for Tdap vaccination   3. Hypercalcemia      1) Anticipatory Guidance: Skin CA prevention- recommend to continue sunscreen when outside along with skin surveillance; eat a balanced and modest diet; physical activity at least 25 minutes per day or minimum of 150 min/ week moderate to intense activity.  2) Immunizations / Screenings / Labs:   All immunizations are up-to-date per recommendations or will be updated today if pt allows.    - Patient understands with dental and vision screens they will schedule independently.  - Obtained CBC, CMP, HgA1c, Lipid panel, TSH. Most labs are essentially wnl's or stable from prior. Pt denies prior work-up for hypercalcemia, will obtain additional labs today. -Pt agreeable to Tdap. Declines Shingrix, Hep C and HIV screenings. - UTD on colonoscopy.  3) Weight:  Continue healthy diet habits to improve overall feelings of well being and objective health data. Improve nutrient density of diet through increasing intake of fruits and vegetables and decreasing saturated fats, white flour products and refined sugars. -BMI wnl's   4) Healthcare Maintenance: -Recommend to follow up with OB/GYN (Dr.Silva) for female exam and mammogram. Patient declined today. -Continue heart healthy diet and increase hydration.  -Follow up in year for CPE and FBW or sooner if needed.     No orders of the defined types were placed in this encounter.   Orders Placed This Encounter  Procedures  . Tdap vaccine greater than or equal to 7yo IM  . Calcium, ionized  . PTH, intact (no Ca)  . Magnesium  . Vitamin D (25 hydroxy)     Return in about 1 year (around 04/09/2022) for CPE and FBW few days prior .     Gross side effects, risk and benefits, and alternatives of medications discussed with patient.  Patient is aware that all medications have potential side effects and we are  unable to predict every side effect or drug-drug interaction that may occur.  Expresses verbal understanding and consents to current therapy plan and treatment regimen.  F-up preventative CPE in 1 year- reminded pt again, this is in addition to any chronic care visits.    Please see orders placed and AVS handed out to patient at the end of our visit for further patient instructions/ counseling done pertaining to today's office visit.   Note:  This note was prepared with assistance of Dragon voice recognition software. Occasional wrong-word or sound-a-like substitutions may have occurred due to the inherent limitations of voice recognition software.     Subjective:     CPE HPI: MAN BONNEAU is a 60 y.o. female who presents to [facility] today for a yearly health maintenance exam.   Health Maintenance Summary  - Reviewed and updated, unless pt declines services.  Last Cologuard or Colonoscopy:  01/21/2012- repeat in 10 years  Family history of Colon CA: N  Tobacco History Reviewed:  Y, never a smoker  CT scan for screening lung CA: N/A Alcohol and/or drug use:    No concerns; no use Dental Home:Y Eye exams:Y Dermatology home:N  Female Health:  PAP Smear - last known results: Patient plans to follow up with OB-GYN. Reports last pap in 2013. STD concerns:   none Birth control method:  N/a, postmenopausal  Menses regular: N/a, postmenopausal  Lumps or breast concerns:  none Breast Cancer Family History:  No   Additional  concerns beyond health maintenance issues: none    Immunization History  Administered Date(s) Administered  . Influenza, Seasonal, Injecte, Preservative Fre 12/09/2011  . Pneumococcal Polysaccharide-23 05/12/2016  . Td 04/01/2009  . Tdap 04/09/2021     Health Maintenance  Topic Date Due  . COVID-19 Vaccine (1) Never done  . PAP SMEAR-Modifier  Never done  . MAMMOGRAM  12/07/2012  . INFLUENZA VACCINE  06/16/2021  .  COLONOSCOPY (Pts 45-75yrs Insurance coverage will need to be confirmed)  01/20/2022  . TETANUS/TDAP  04/10/2031  . Hepatitis C Screening  Completed  . HIV Screening  Completed  . HPV VACCINES  Aged Out     Wt Readings from Last 3 Encounters:  04/09/21 113 lb 8 oz (51.5 kg)  01/06/21 114 lb 1.6 oz (51.8 kg)  01/18/18 114 lb 2 oz (51.8 kg)   BP Readings from Last 3 Encounters:  04/09/21 99/67  01/06/21 101/68  01/18/18 116/64   Pulse Readings from Last 3 Encounters:  04/09/21 76  01/06/21 74  01/18/18 70     Past Medical History:  Diagnosis Date  . Headache(784.0)   . Menopause       Past Surgical History:  Procedure Laterality Date  . INGUINAL HERNIA REPAIR    . 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 TONSILLECTOMY        Family History  Problem Relation Age of Onset  . Hypertension Mother   . Liver cancer Maternal Grandmother   . Colon cancer Neg Hx   . Breast cancer Neg Hx   . CAD Neg Hx       Social History   Substance and Sexual Activity  Drug Use No  ,   Social History   Substance and Sexual Activity  Alcohol Use Yes  . Alcohol/week: 6.0 standard drinks  . Types: 6 Glasses of wine per week   Comment: occassional  ,   Social History   Tobacco Use  Smoking Status Never Smoker  Smokeless Tobacco Never Used  ,   Social History   Substance and Sexual Activity  Sexual Activity Not Currently    No current outpatient medications on file prior to visit.   No current facility-administered medications on file prior to visit.    Allergies: Patient has no known allergies.  Review of Systems: General:   Denies fever, chills, unexplained weight loss.  Optho/Auditory:   Denies visual changes, blurred vision/LOV Respiratory:   Denies SOB, DOE more than baseline levels.   Cardiovascular:   Denies chest pain, palpitations, new onset peripheral edema  Gastrointestinal:   Denies nausea, vomiting, diarrhea.  Genitourinary: Denies dysuria, freq/  urgency, flank pain or discharge  Endocrine:     Denies hot or cold intolerance, polyuria, polydipsia. Musculoskeletal:   Denies unexplained myalgias, joint swelling, unexplained arthralgias, gait problems.  Skin:  Denies rash, suspicious lesions Neurological:     Denies dizziness, unexplained weakness, numbness  Psychiatric/Behavioral:   Denies mood changes, suicidal or homicidal ideations, hallucinations    Objective:    Blood pressure 99/67, pulse 76, temperature (!) 97.4 F (36.3 C), height 5\' 4"  (1.626 m), weight 113 lb 8 oz (51.5 kg), SpO2 100 %. Body mass index is 19.48 kg/m. General Appearance:    Alert, cooperative, no distress, appears stated age  Head:    Normocephalic, without obvious abnormality, atraumatic  Eyes:    PERRL, conjunctiva/corneas clear, EOM's intact, fundi    benign, both eyes  Ears:    Normal TM's and external ear canals,  both ears  Nose:   Nares normal, septum midline, mucosa normal, no drainage    or sinus tenderness  Throat:   Lips w/o lesion, mucosa moist, and tongue normal; teeth and   gums normal  Neck:   Supple, symmetrical, trachea midline, no adenopathy;    thyroid:  no enlargement/tenderness/nodules; no carotid   bruit or JVD  Back:     Symmetric, no curvature, ROM normal, no CVA tenderness  Lungs:     Clear to auscultation bilaterally, respirations unlabored, no       Wh/ R/ R  Chest Wall:    No tenderness or gross deformity; normal excursion   Heart:    Regular rate and rhythm, S1 and S2 normal, no murmur, rub   or gallop  Breast Exam:   Deferred to OB/GYN.  Abdomen:     Soft, non-tender, bowel sounds active all four quadrants, No   G/R/R, no masses, no organomegaly  Genitalia:   Deferred to OB/GYN.  Rectal:   Deferred to OB/GYN.  Extremities:   Extremities normal, atraumatic, no cyanosis or gross edema  Pulses:   2+ and symmetric all extremities  Skin:   Warm, dry, Skin color, texture, turgor normal, no obvious rashes or lesions Psych: No  HI/SI, judgement and insight good, Euthymic mood. Full Affect.  Neurologic:   CNII-XII grossly intact

## 2021-04-09 NOTE — Patient Instructions (Signed)
Preventive Care 84-59 Years Old, Female Preventive care refers to lifestyle choices and visits with your health care provider that can promote health and wellness. This includes:  A yearly physical exam. This is also called an annual wellness visit.  Regular dental and eye exams.  Immunizations.  Screening for certain conditions.  Healthy lifestyle choices, such as: ? Eating a healthy diet. ? Getting regular exercise. ? Not using drugs or products that contain nicotine and tobacco. ? Limiting alcohol use. What can I expect for my preventive care visit? Physical exam Your health care provider will check your:  Height and weight. These may be used to calculate your BMI (body mass index). BMI is a measurement that tells if you are at a healthy weight.  Heart rate and blood pressure.  Body temperature.  Skin for abnormal spots. Counseling Your health care provider may ask you questions about your:  Past medical problems.  Family's medical history.  Alcohol, tobacco, and drug use.  Emotional well-being.  Home life and relationship well-being.  Sexual activity.  Diet, exercise, and sleep habits.  Work and work Statistician.  Access to firearms.  Method of birth control.  Menstrual cycle.  Pregnancy history. What immunizations do I need? Vaccines are usually given at various ages, according to a schedule. Your health care provider will recommend vaccines for you based on your age, medical history, and lifestyle or other factors, such as travel or where you work.   What tests do I need? Blood tests  Lipid and cholesterol levels. These may be checked every 5 years, or more often if you are over 59 years old.  Hepatitis C test.  Hepatitis B test. Screening  Lung cancer screening. You may have this screening every year starting at age 59 if you have a 30-pack-year history of smoking and currently smoke or have quit within the past 15 years.  Colorectal cancer  screening. ? All adults should have this screening starting at age 59 and continuing until age 17. ? Your health care provider may recommend screening at age 59 if you are at increased risk. ? You will have tests every 1-10 years, depending on your results and the type of screening test.  Diabetes screening. ? This is done by checking your blood sugar (glucose) after you have not eaten for a while (fasting). ? You may have this done every 1-3 years.  Mammogram. ? This may be done every 1-2 years. ? Talk with your health care provider about when you should start having regular mammograms. This may depend on whether you have a family history of breast cancer.  BRCA-related cancer screening. This may be done if you have a family history of breast, ovarian, tubal, or peritoneal cancers.  Pelvic exam and Pap test. ? This may be done every 3 years starting at age 10. ? Starting at age 11, this may be done every 5 years if you have a Pap test in combination with an HPV test. Other tests  STD (sexually transmitted disease) testing, if you are at risk.  Bone density scan. This is done to screen for osteoporosis. You may have this scan if you are at high risk for osteoporosis. Talk with your health care provider about your test results, treatment options, and if necessary, the need for more tests. Follow these instructions at home: Eating and drinking  Eat a diet that includes fresh fruits and vegetables, whole grains, lean protein, and low-fat dairy products.  Take vitamin and mineral supplements  as recommended by your health care provider.  Do not drink alcohol if: ? Your health care provider tells you not to drink. ? You are pregnant, may be pregnant, or are planning to become pregnant.  If you drink alcohol: ? Limit how much you have to 0-1 drink a day. ? Be aware of how much alcohol is in your drink. In the [place] one drink equals one 12 oz bottle of beer (355 mL), one 5 oz glass of  wine (148 mL), or one 1 oz glass of hard liquor (44 mL).   Lifestyle  Take daily care of your teeth and gums. Brush your teeth every morning and night with fluoride toothpaste. Floss one time each day.  Stay active. Exercise for at least 30 minutes 5 or more days each week.  Do not use any products that contain nicotine or tobacco, such as cigarettes, e-cigarettes, and chewing tobacco. If you need help quitting, ask your health care provider.  Do not use drugs.  If you are sexually active, practice safe sex. Use a condom or other form of protection to prevent STIs (sexually transmitted infections).  If you do not wish to become pregnant, use a form of birth control. If you plan to become pregnant, see your health care provider for a prepregnancy visit.  If told by your health care provider, take low-dose aspirin daily starting at age 50.  Find healthy ways to cope with stress, such as: ? Meditation, yoga, or listening to music. ? Journaling. ? Talking to a trusted person. ? Spending time with friends and family. Safety  Always wear your seat belt while driving or riding in a vehicle.  Do not drive: ? If you have been drinking alcohol. Do not ride with someone who has been drinking. ? When you are tired or distracted. ? While texting.  Wear a helmet and other protective equipment during sports activities.  If you have firearms in your house, make sure you follow all gun safety procedures. What's next?  Visit your health care provider once a year for an annual wellness visit.  Ask your health care provider how often you should have your eyes and teeth checked.  Stay up to date on all vaccines. This information is not intended to replace advice given to you by your health care provider. Make sure you discuss any questions you have with your health care provider. Document Revised: 08/06/2020 Document Reviewed: 07/14/2018 Elsevier Patient Education  2021 Elsevier Inc.  

## 2021-04-11 LAB — CALCIUM, IONIZED: Calcium, Ion: 5.9 mg/dL — ABNORMAL HIGH (ref 4.5–5.6)

## 2021-04-11 LAB — MAGNESIUM: Magnesium: 2.1 mg/dL (ref 1.6–2.3)

## 2021-04-11 LAB — VITAMIN D 25 HYDROXY (VIT D DEFICIENCY, FRACTURES): Vit D, 25-Hydroxy: 35.7 ng/mL (ref 30.0–100.0)

## 2021-04-11 LAB — PARATHYROID HORMONE, INTACT (NO CA): PTH: 61 pg/mL (ref 15–65)

## 2021-04-22 ENCOUNTER — Other Ambulatory Visit: Payer: Self-pay | Admitting: Physician Assistant

## 2021-04-22 DIAGNOSIS — R899 Unspecified abnormal finding in specimens from other organs, systems and tissues: Secondary | ICD-10-CM

## 2021-04-23 ENCOUNTER — Other Ambulatory Visit: Payer: Self-pay | Admitting: Physician Assistant

## 2021-04-23 ENCOUNTER — Other Ambulatory Visit: Payer: Self-pay

## 2021-04-23 ENCOUNTER — Other Ambulatory Visit: Payer: PRIVATE HEALTH INSURANCE

## 2021-04-23 DIAGNOSIS — R899 Unspecified abnormal finding in specimens from other organs, systems and tissues: Secondary | ICD-10-CM

## 2021-04-24 LAB — CALCIUM, IONIZED: Calcium, Ion: 5.8 mg/dL — ABNORMAL HIGH (ref 4.5–5.6)

## 2021-05-07 ENCOUNTER — Other Ambulatory Visit: Payer: Self-pay

## 2021-05-07 ENCOUNTER — Telehealth: Payer: Self-pay | Admitting: Physician Assistant

## 2021-05-07 DIAGNOSIS — R899 Unspecified abnormal finding in specimens from other organs, systems and tissues: Secondary | ICD-10-CM

## 2021-05-07 NOTE — Telephone Encounter (Signed)
Referral placed to [facility].

## 2021-05-07 NOTE — Telephone Encounter (Signed)
Patient needs a referral for an endocrinologist as she never heard anything from the previous referral placed. Please advise, thanks.

## 2021-05-08 NOTE — Telephone Encounter (Signed)
Informed patient referral has been received by [facility]. Appointment should be made in 1-2 days.

## 2021-08-05 ENCOUNTER — Other Ambulatory Visit: Payer: Self-pay

## 2021-08-05 ENCOUNTER — Encounter: Payer: Self-pay | Admitting: Endocrinology

## 2021-08-05 ENCOUNTER — Ambulatory Visit (INDEPENDENT_AMBULATORY_CARE_PROVIDER_SITE_OTHER): Payer: PRIVATE HEALTH INSURANCE | Admitting: Endocrinology

## 2021-08-05 NOTE — Patient Instructions (Addendum)
Blood tests are requested for you today.  We'll let you know about the results.  If these are normal, the calcium does not need treatment unless it is much higher.   If these don't show a cause, the next step is to check a 24-HR urine test.  I would be happy to see you back here as needed.  You should have the blood calcium checked 1-2 times per year, and as needed.

## 2021-08-05 NOTE — Progress Notes (Signed)
Subjective:    Patient ID: Monica Sweeney, female    DOB: 10-22-1961, 60 y.o.   MRN: [id number]  HPI Pt is referred by Lowella Petties, PA, for hypercalcemia.  Pt was noted to have hypercalcemia in 2013.  she has never had osteoporosis, urolithiasis, thyroid probs, parathyroid probs, sarcoidosis, cancer, PUD, pancreatitis, or bony fracture.  she does not take vitamin-D or A supplements.  Pt denies taking antacids, Li++, or HCTZ.   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[employer]  Tobacco Use   Smoking status: Never   Smokeless tobacco: Never  Substance and Sexual Activity   Alcohol use: Yes    Alcohol/week: 6.0 standard drinks    Types: 6 Glasses of wine per week    Comment: occassional   Drug use: No   Sexual activity: Not Currently  Other Topics Concern   Not on file  Social History Narrative   2 children, independent     Social Determinants of Health   Financial Resource Strain: Not on file  Food Insecurity: Not on file  Transportation Needs: Not on file  Physical Activity: Not on file  Stress: Not on file  Social Connections: Not on file  Intimate Partner Violence: Not on file    No current outpatient medications on file prior to visit.   No current facility-administered medications on file prior to visit.    No Known Allergies  Family History  Problem Relation Age of Onset   Hypertension Mother    Liver cancer Maternal  Grandmother    Colon cancer Neg Hx    Breast cancer Neg Hx    CAD Neg Hx    Hypercalcemia Neg Hx     BP 110/64 (BP Location: Right Arm, Patient Position: Sitting, Cuff Size: Normal)   Pulse 77   Ht 5\' 4"  (1.626 m)   Wt 117 lb (53.1 kg)   SpO2 97%   BMI 20.08 kg/m     Review of Systems Denies weight loss, polyuria, depression, and back pain.      Objective:   Physical Exam VS: see vs page GEN: no distress HEAD: head: no deformity eyes: no periorbital swelling, no proptosis external nose and ears are normal NECK: supple, thyroid is not enlarged CHEST WALL: no kyphosis LUNGS: clear to auscultation CV: reg rate and rhythm, no murmur.  MUSCULOSKELETAL: gait is normal and steady EXTEMITIES: no deformity.  no leg edema NEURO:  readily moves all 4's.  sensation is intact to touch on all 4's SKIN:  Normal texture and temperature.  No rash or suspicious lesion is visible.   NODES:  None palpable at the neck PSYCH: alert, well-oriented.  Does not appear anxious nor depressed.   Lab Results  Component Value Date   PTH 61 04/09/2021   CALCIUM 10.6 (H) 04/01/2021   CAION 5.8 (H) 04/23/2021   Lab Results  Component  Value Date   ALT 15 04/01/2021   AST 17 04/01/2021   ALKPHOS 78 04/01/2021   BILITOT 0.4 04/01/2021   Lab Results  Component Value Date   TSH 1.480 04/01/2021   25-OH Vit-D=36.  I have reviewed outside records, and summarized: Pt was noted to have elevated Ca++, and referred here.  Wellness was addressed.  General health was good.        Assessment & Plan:  Hypercalcemia, new to me, uncontrolled.  No medication is needed for this.   Patient Instructions  Blood tests are requested for you today.  We'll let you know about the results.  If these are normal, the calcium does not need treatment unless it is much higher.   If these don't show a cause, the next step is to check a 24-HR urine test.  I would be happy to see you back here as needed.  You should  have the blood calcium checked 1-2 times per year, and as needed.

## 2021-08-12 LAB — VITAMIN D 1,25 DIHYDROXY
Vitamin D 1, 25 (OH)2 Total: 57 pg/mL (ref 18–72)
Vitamin D2 1, 25 (OH)2: <8 pg/mL
Vitamin D3 1, 25 (OH)2: 57 pg/mL

## 2021-08-12 LAB — PTH-RELATED PEPTIDE: PTH-Related Protein (PTH-RP): 13 pg/mL (ref 11–20)

## 2021-08-12 LAB — VITAMIN A: Vitamin A (Retinoic Acid): 67 ug/dL (ref 38–98)

## 2021-08-13 ENCOUNTER — Other Ambulatory Visit: Payer: Self-pay | Admitting: Endocrinology

## 2022-01-14 ENCOUNTER — Encounter: Payer: Self-pay | Admitting: Nurse Practitioner

## 2022-01-14 ENCOUNTER — Ambulatory Visit (INDEPENDENT_AMBULATORY_CARE_PROVIDER_SITE_OTHER): Payer: PRIVATE HEALTH INSURANCE | Admitting: Nurse Practitioner

## 2022-01-14 ENCOUNTER — Other Ambulatory Visit: Payer: Self-pay

## 2022-01-14 VITALS — BP 101/65 | HR 63 | Temp 97.5°F | Ht 64.0 in | Wt 117.6 lb

## 2022-01-14 DIAGNOSIS — R319 Hematuria, unspecified: Secondary | ICD-10-CM

## 2022-01-14 DIAGNOSIS — N390 Urinary tract infection, site not specified: Secondary | ICD-10-CM | POA: Diagnosis not present

## 2022-01-14 LAB — POCT URINALYSIS DIP (CLINITEK)
Bilirubin, UA: NEGATIVE
Glucose, UA: NEGATIVE mg/dL
Ketones, POC UA: NEGATIVE mg/dL
Nitrite, UA: NEGATIVE
POC PROTEIN,UA: NEGATIVE
Spec Grav, UA: 1.01 (ref 1.010–1.025)
Urobilinogen, UA: 0.2 E.U./dL
pH, UA: 7 (ref 5.0–8.0)

## 2022-01-14 MED ORDER — NITROFURANTOIN MONOHYD MACRO 100 MG PO CAPS: 100.0000 mg | ORAL_CAPSULE | Freq: Two times a day (BID) | ORAL | 0 refills | Status: DC

## 2022-01-14 NOTE — Progress Notes (Signed)
Established patient visit ? ? ?Patient: Monica Sweeney   DOB: 01-28-1961   61 y.o. Female  MRN: [id number] ?Visit Date: 01/14/2022 ? ?Chief Complaint  ?Patient presents with  ? Urinary Tract Infection  ? ?Subjective  ?  ?Urinary Tract Infection  ?This is a new problem. The current episode started in the past 7 days. The problem occurs every urination. The problem has been unchanged. The quality of the pain is described as burning. There has been no fever. The fever has been present for 3 - 4 days. There is No history of pyelonephritis. Associated symptoms include frequency, hesitancy and urgency. Pertinent negatives include no chills, discharge, flank pain, hematuria, nausea, possible pregnancy, sweats or vomiting. She has tried increased fluids (OTC AZO which did help some) for the symptoms. The treatment provided moderate relief.   ? ? ?Medications: ?No outpatient medications prior to visit.  ? ?No facility-administered medications prior to visit.  ? ? ?Review of Systems  ?Constitutional:  Negative for activity change, appetite change, chills, fatigue and fever.  ?HENT:  Negative for congestion, postnasal drip, rhinorrhea, sinus pressure, sinus pain, sneezing and sore throat.   ?Eyes: Negative.   ?Respiratory:  Negative for cough, chest tightness, shortness of breath and wheezing.   ?Cardiovascular:  Negative for chest pain and palpitations.  ?Gastrointestinal:  Negative for abdominal pain, constipation, diarrhea, nausea and vomiting.  ?Endocrine: Negative for cold intolerance, heat intolerance, polydipsia and polyuria.  ?Genitourinary:  Positive for dysuria, frequency, hesitancy and urgency. Negative for dyspareunia, flank pain and hematuria.  ?Musculoskeletal:  Negative for arthralgias, back pain and myalgias.  ?Skin:  Negative for rash.  ?Allergic/Immunologic: Negative for environmental allergies.  ?Neurological:  Negative for dizziness, weakness and headaches.  ?Hematological:  Negative for adenopathy.   ?Psychiatric/Behavioral:  The patient is not nervous/anxious.   ? ? Objective  ?  ? ?Today's Vitals  ? 01/14/22 9924  ?BP: 101/65  ?Pulse: 63  ?Temp: (!) 97.5 ?F (36.4 ?C)  ?SpO2: 99%  ?Weight: 117 lb 9.6 oz (53.3 kg)  ?Height: 5\' 4"  (1.626 m)  ? ?Body mass index is 20.19 kg/m?.  ? ?Physical Exam ?Vitals and nursing note reviewed.  ?Constitutional:   ?   Appearance: Normal appearance. She is well-developed.  ?HENT:  ?   Head: Normocephalic.  ?Eyes:  ?   Pupils: Pupils are equal, round, and reactive to light.  ?Cardiovascular:  ?   Rate and Rhythm: Normal rate and regular rhythm.  ?   Pulses: Normal pulses.  ?   Heart sounds: Normal heart sounds.  ?Pulmonary:  ?   Effort: Pulmonary effort is normal.  ?   Breath sounds: Normal breath sounds.  ?Abdominal:  ?   Palpations: Abdomen is soft.  ?Genitourinary: ?   Comments: Urine sample positive for small WBC and trace blood.  ?Musculoskeletal:     ?   General: Normal range of motion.  ?   Cervical back: Normal range of motion and neck supple.  ?Lymphadenopathy:  ?   Cervical: No cervical adenopathy.  ?Skin: ?   General: Skin is warm and dry.  ?   Capillary Refill: Capillary refill takes less than 2 seconds.  ?Neurological:  ?   General: No focal deficit present.  ?   Mental Status: She is alert and oriented to person, place, and time.  ?Psychiatric:     ?   Mood and Affect: Mood normal.     ?   Behavior: Behavior normal.     ?  Thought Content: Thought content normal.     ?   Judgment: Judgment normal.  ?  ? ? ?Results for orders placed or performed in visit on 01/14/22  ?POCT URINALYSIS DIP (CLINITEK)  ?Result Value Ref Range  ? Color, UA yellow yellow  ? Clarity, UA clear clear  ? Glucose, UA negative negative mg/dL  ? Bilirubin, UA negative negative  ? Ketones, POC UA negative negative mg/dL  ? Spec Grav, UA 1.010 1.010 - 1.025  ? Blood, UA trace-intact (A) negative  ? pH, UA 7.0 5.0 - 8.0  ? POC PROTEIN,UA negative negative, trace  ? Urobilinogen, UA 0.2 0.2 or 1.0  E.U./dL  ? Nitrite, UA Negative Negative  ? Leukocytes, UA Small (1+) (A) Negative  ? ? Assessment & Plan  ?  ? ?1. Urinary tract infection with hematuria, site unspecified ?Start macrobid 100mg  bid for 5 days. Send urine for culture and sensitivity and adjust antibiotics as indicated. Recommend she continue using OTC Azo prn for burning and dysuria. Also recommended she increase fluid intake. The patient voiced understanding and agreement.  ?- nitrofurantoin, macrocrystal-monohydrate, (MACROBID) 100 MG capsule; Take 1 capsule (100 mg total) by mouth 2 (two) times daily.  Dispense: 10 capsule; Refill: 0  ? ?Return for prn worsening or persistent symptoms.  ?   ? ? ? ? , NP  ?[facility] ?[phone] (phone) ?[phone] (fax) ? ?[facility] ?

## 2022-01-17 LAB — URINE CULTURE

## 2022-01-19 NOTE — Progress Notes (Signed)
Patient started on macrobid at time of visit

## 2022-02-02 ENCOUNTER — Encounter: Payer: Self-pay | Admitting: Physician Assistant

## 2022-08-13 ENCOUNTER — Telehealth: Payer: PRIVATE HEALTH INSURANCE | Admitting: Physician Assistant

## 2022-08-13 DIAGNOSIS — R3989 Other symptoms and signs involving the genitourinary system: Secondary | ICD-10-CM

## 2022-08-13 MED ORDER — CEPHALEXIN 500 MG PO CAPS: 500.0000 mg | ORAL_CAPSULE | Freq: Two times a day (BID) | ORAL | 0 refills | Status: AC

## 2022-08-13 NOTE — Progress Notes (Signed)
I have spent 5 minutes in review of e-visit questionnaire, review and updating patient chart, medical decision making and response to patient.   Lexiana Spindel Cody Quartez [place], PA-C    

## 2022-08-13 NOTE — Progress Notes (Signed)

## 2022-09-15 ENCOUNTER — Ambulatory Visit: Payer: PRIVATE HEALTH INSURANCE | Admitting: Physician Assistant

## 2022-09-17 ENCOUNTER — Ambulatory Visit (INDEPENDENT_AMBULATORY_CARE_PROVIDER_SITE_OTHER): Payer: PRIVATE HEALTH INSURANCE | Admitting: Physician Assistant

## 2022-09-17 ENCOUNTER — Encounter: Payer: Self-pay | Admitting: Physician Assistant

## 2022-09-17 VITALS — BP 103/69 | HR 111 | Resp 18 | Ht 64.0 in | Wt 119.0 lb

## 2022-09-17 DIAGNOSIS — R590 Localized enlarged lymph nodes: Secondary | ICD-10-CM | POA: Diagnosis not present

## 2022-09-17 NOTE — Patient Instructions (Signed)
Lymphadenopathy  Lymphadenopathy means that your lymph glands are swollen or larger than normal. Lymph glands, also called lymph nodes, are collections of tissue that filter excess fluid, bacteria, viruses, and waste from your bloodstream. They are part of your body's disease-fighting system (immune system), which protects your body from germs. There may be different causes of lymphadenopathy, depending on where it is in your body. Some types go away on their own. Lymphadenopathy can occur anywhere that you have lymph glands, including these areas: Neck (cervical lymphadenopathy). Chest (mediastinal lymphadenopathy). Lungs (hilar lymphadenopathy). Underarms (axillary lymphadenopathy). Groin (inguinal lymphadenopathy). When your immune system responds to germs, infection-fighting cells and fluid build up in your lymph glands. This causes some swelling and enlargement. If the lymph nodes do not go back to normal size after you have an infection or disease, your health care provider may do tests. These tests help to monitor your condition and find the reason why the glands are still swollen and enlarged. Follow these instructions at home:  Get plenty of rest. Your health care provider may recommend over-the-counter medicines for pain. Take over-the-counter and prescription medicines only as told by your health care provider. If directed, apply heat to swollen lymph glands as often as told by your health care provider. Use the heat source that your health care provider recommends, such as a moist heat pack or a heating pad. Place a towel between your skin and the heat source. Leave the heat on for 20-30 minutes. Remove the heat if your skin turns bright red. This is especially important if you are unable to feel pain, heat, or cold. You may have a greater risk of getting burned. Check your affected lymph glands every day for changes. Check other lymph gland areas as told by your health care provider.  Check for changes such as: More swelling. Sudden increase in size. Redness or pain. Hardness. Keep all follow-up visits. This is important. Contact a health care provider if you have: Lymph glands that: Are still swollen after 2 weeks. Have suddenly gotten bigger or the swelling spreads. Are red, painful, or hard. Fluid leaking from the skin near an enlarged lymph gland. Problems with breathing. A fever, chills, or night sweats. Fatigue. A sore throat. Pain in your abdomen. Weight loss. Get help right away if you have: Severe pain. Chest pain. Shortness of breath. These symptoms may represent a serious problem that is an emergency. Do not wait to see if the symptoms will go away. Get medical help right away. Call your local emergency services (911 in the [place] Do not drive yourself to the hospital. Summary Lymphadenopathy means that your lymph glands are swollen or larger than normal. Lymph glands, also called lymph nodes, are collections of tissue that filter excess fluid, bacteria, viruses, and waste from the bloodstream. They are part of your body's disease-fighting system (immune system). Lymphadenopathy can occur anywhere that you have lymph glands. If the lymph nodes do not go back to normal size after you have an infection or disease, your health care provider may do tests to monitor your condition and find the reason why the glands are still swollen and enlarged. Check your affected lymph glands every day for changes. Check other lymph gland areas as told by your health care provider. This information is not intended to replace advice given to you by your health care provider. Make sure you discuss any questions you have with your health care provider. Document Revised: 08/28/2020 Document Reviewed: 08/28/2020 Elsevier Patient Education    2023 [employer] Inc[employer]  

## 2022-09-17 NOTE — Progress Notes (Signed)
  Established patient acute visit   Patient: Monica Sweeney   DOB: 20-Aug-1961   61 y.o. Female  MRN: [id number] Visit Date: 09/17/2022  Chief Complaint  Patient presents with   Mass    Right side of neck     Subjective    HPI HPI     Mass    Additional comments: Right side of neck        Last edited by Gemma Payor, CMA on 09/17/2022  1:30 PM.      Patient presents for neck mass on the right side for a few days. Patient reports started getting sick yesterday with cold-like symptoms. Denies unintentional weight loss. Has chronic night sweats. No fever.     Medications: No outpatient medications prior to visit.   No facility-administered medications prior to visit.    Review of Systems Review of Systems:  A fourteen system review of systems was performed and found to be positive as per HPI.     Objective    BP 103/69 (BP Location: Left Arm, Patient Position: Sitting, Cuff Size: Normal)   Pulse (!) 111   Resp 18   Ht 5\' 4"  (1.626 m)   Wt 119 lb (54 kg)   SpO2 99%   BMI 20.43 kg/m    Physical Exam  General:  Well Developed, well nourished, appropriate for stated age.  Neuro:  Alert and oriented,  extra-ocular muscles intact  HEENT:  Normocephalic, atraumatic, neck supple, soft and mobile mass above right clavicle   Skin:  no gross rash, warm, pink. Cardiac:  RRR, S1 S2 Respiratory: CTA B/L  Vascular:  Ext warm, no cyanosis apprec.; cap RF less 2 sec. Psych:  No HI/SI, judgement and insight good, Euthymic mood. Full Affect.   No results found for any visits on 09/17/22.  Assessment & Plan     Discussed with patient soft mass is suggestive of swollen lymph node (supraclavicular) likely secondary to current illness. Discussed lymph node should improve as she becomes better. If lymph node remains swollen even after her illness has resolved recommend further evaluation with soft tissue ultrasound. Patient verbalized understanding.   Return if symptoms  worsen or fail to improve.        Lorrene Reid, PA-C  [facility] [phone] (phone) [phone] (fax)  [place]

## 2022-09-21 ENCOUNTER — Ambulatory Visit: Payer: PRIVATE HEALTH INSURANCE | Admitting: Physician Assistant

## 2022-11-25 ENCOUNTER — Encounter: Payer: Self-pay | Admitting: Nurse Practitioner

## 2022-11-25 ENCOUNTER — Ambulatory Visit (INDEPENDENT_AMBULATORY_CARE_PROVIDER_SITE_OTHER): Payer: PRIVATE HEALTH INSURANCE | Admitting: Nurse Practitioner

## 2022-11-25 VITALS — BP 114/74 | HR 88 | Resp 20 | Ht 64.0 in | Wt 117.0 lb

## 2022-11-25 DIAGNOSIS — H1013 Acute atopic conjunctivitis, bilateral: Secondary | ICD-10-CM | POA: Diagnosis not present

## 2022-11-25 MED ORDER — OLOPATADINE HCL 0.2 % OP SOLN: 1.0000 [drp] | Freq: Every day | OPHTHALMIC | 1 refills | Status: DC

## 2022-11-25 NOTE — Progress Notes (Signed)
Established patient visit   Patient: Monica Sweeney   DOB: [birth year]   62 y.o. Female  MRN: [id number] Visit Date: 11/25/2022  Chief Complaint  Patient presents with   Eye Problem    Bilateral Itching and watery. Rt is worse.   Subjective    Eye Problem  Both eyes are affected. This is a recurrent problem. The problem occurs constantly. The problem has been unchanged. There was no injury mechanism. There is No known exposure to pink eye. She Wears contacts. Associated symptoms include eye redness and itching. Pertinent negatives include no eye discharge, fever, foreign body sensation, nausea, vomiting or weakness. Associated symptoms comments: Does have double vision, but this is not a new symptom for her. . She has tried eye drops (antibiotic eye drop) for the symptoms. The treatment provided no relief.   HPI     Eye Problem    Additional comments: Bilateral Itching and watery. Rt is worse.      Last edited by Gemma Payor, CMA on 11/25/2022  2:52 PM.        Medications: Outpatient Medications Prior to Visit  Medication Sig   ciprofloxacin (CILOXAN) 0.3 % ophthalmic solution Place 2 drops into both eyes 4 (four) times daily.   No facility-administered medications prior to visit.    Review of Systems  Constitutional:  Negative for activity change, appetite change, chills, fatigue and fever.  HENT:  Negative for congestion, postnasal drip, rhinorrhea, sinus pressure, sinus pain, sneezing and sore throat.   Eyes:  Positive for redness and itching. Negative for discharge.  Respiratory:  Negative for cough, chest tightness, shortness of breath and wheezing.   Cardiovascular:  Negative for chest pain and palpitations.  Gastrointestinal:  Negative for abdominal pain, constipation, diarrhea, nausea and vomiting.  Endocrine: Negative for cold intolerance, heat intolerance, polydipsia and polyuria.  Genitourinary:  Negative for dyspareunia, dysuria, flank pain, frequency and  urgency.  Musculoskeletal:  Negative for arthralgias, back pain and myalgias.  Skin:  Negative for rash.  Allergic/Immunologic: Negative for environmental allergies.  Neurological:  Negative for dizziness, weakness and headaches.  Hematological:  Negative for adenopathy.  Psychiatric/Behavioral:  The patient is not nervous/anxious.      Objective     Today's Vitals   11/25/22 1452  BP: 114/74  Pulse: 88  Resp: 20  Weight: 117 lb (53.1 kg)  Height: 5' 4"$  (1.626 m)   Body mass index is 20.08 kg/m.   Physical Exam Vitals and nursing note reviewed.  Constitutional:      Appearance: Normal appearance. She is well-developed.  HENT:     Head: Normocephalic.  Eyes:     General:        Right eye: Discharge present.        Left eye: Discharge present.    Pupils: Pupils are equal, round, and reactive to light.     Comments: Conjunctiva and sclera are red and irritated bilaterally. Excess tearing is noted. Some upper and lower lid edema is noted.   Cardiovascular:     Rate and Rhythm: Normal rate and regular rhythm.     Pulses: Normal pulses.     Heart sounds: Normal heart sounds.  Pulmonary:     Effort: Pulmonary effort is normal.     Breath sounds: Normal breath sounds.  Abdominal:     Palpations: Abdomen is soft.  Musculoskeletal:        General: Normal range of motion.     Cervical back: Normal range of motion  and neck supple.  Lymphadenopathy:     Cervical: No cervical adenopathy.  Skin:    General: Skin is warm and dry.     Capillary Refill: Capillary refill takes less than 2 seconds.  Neurological:     General: No focal deficit present.     Mental Status: She is alert and oriented to person, place, and time.  Psychiatric:        Mood and Affect: Mood normal.        Behavior: Behavior normal.        Thought Content: Thought content normal.        Judgment: Judgment normal.       Assessment & Plan     1. Allergic conjunctivitis of both eyes Trial pataday  eye drops - use one drop in both eyes daily. Recommend new eye make up and trial oral antihistamines. Reassess if worse and as needed.  - Olopatadine HCl 0.2 % SOLN; Apply 1 drop to eye daily.  Dispense: 2.5 mL; Refill: 1   Return for prn worsening or persistent symptoms.        Ronnell Freshwater, NP  [facility] [phone] (phone) [phone] (fax)  [place]

## 2022-12-09 ENCOUNTER — Ambulatory Visit (INDEPENDENT_AMBULATORY_CARE_PROVIDER_SITE_OTHER): Payer: PRIVATE HEALTH INSURANCE | Admitting: Nurse Practitioner

## 2022-12-09 ENCOUNTER — Encounter: Payer: Self-pay | Admitting: Nurse Practitioner

## 2022-12-09 VITALS — BP 107/72 | HR 68 | Resp 18 | Ht 64.0 in | Wt 119.0 lb

## 2022-12-09 DIAGNOSIS — B9689 Other specified bacterial agents as the cause of diseases classified elsewhere: Secondary | ICD-10-CM

## 2022-12-09 DIAGNOSIS — L409 Psoriasis, unspecified: Secondary | ICD-10-CM | POA: Diagnosis not present

## 2022-12-09 DIAGNOSIS — R590 Localized enlarged lymph nodes: Secondary | ICD-10-CM

## 2022-12-09 DIAGNOSIS — H109 Unspecified conjunctivitis: Secondary | ICD-10-CM | POA: Diagnosis not present

## 2022-12-09 MED ORDER — CLOBETASOL PROPIONATE 0.05 % EX SOLN: 1.0000 | Freq: Two times a day (BID) | CUTANEOUS | 2 refills | Status: DC

## 2022-12-09 MED ORDER — AMOXICILLIN 875 MG PO TABS: 875.0000 mg | ORAL_TABLET | Freq: Two times a day (BID) | ORAL | 0 refills | Status: DC

## 2022-12-09 MED ORDER — ERYTHROMYCIN 5 MG/GM OP OINT: TOPICAL_OINTMENT | OPHTHALMIC | 0 refills | Status: DC

## 2022-12-09 NOTE — Progress Notes (Signed)
Established patient visit   Patient: Monica Sweeney   DOB: 12/05/1960   62 y.o. Female  MRN: [id number] Visit Date: 12/09/2022  Chief Complaint  Patient presents with   Eye Problem   Subjective    Treated for pink eye prior to her initial visit with me Started pataday eye drops approximately 2 weeks ago.  -no change in symptoms.   Eye Problem  Both eyes are affected. This is a recurrent problem. The current episode started 1 to 4 weeks ago. The problem occurs constantly. The problem has been unchanged. There was no injury mechanism. The patient is experiencing no pain. There is No known exposure to pink eye. She Wears contacts (had to start wearing contacts as she cannot see well with her glasses). Associated symptoms include blurred vision, an eye discharge, eye redness and itching.     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 Just getting worse   Medications: Outpatient Medications Prior to Visit  Medication Sig   ciprofloxacin (CILOXAN) 0.3 % ophthalmic solution Place 2 drops into both eyes 4 (four) times daily.   Olopatadine HCl 0.2 % SOLN Apply 1 drop to eye daily.   No facility-administered medications prior to visit.    Review of Systems  Eyes:  Positive for blurred vision, discharge, redness and itching.     Objective     Today's Vitals   12/09/22 1503  BP: 107/72  Pulse: 68  Resp: 18  SpO2: 99%  Weight: 119 lb (54 kg)  Height: '5\' 4"'$  (1.626 m)   Body mass index is 20.43 kg/m.   Physical Exam Vitals and nursing note reviewed.  Constitutional:      Appearance: Normal appearance. She is well-developed.  HENT:     Head: Normocephalic.  Eyes:     General:        Right eye: Discharge present.        Left eye: Discharge present.    Pupils: Pupils are equal, round, and reactive to light.     Comments: Conjunctiva and sclera are red and irritated bilaterally.  Excess tearing is noted. Some upper and lower lid edema is noted.   Cardiovascular:     Rate and Rhythm: Normal rate and regular rhythm.     Pulses: Normal pulses.     Heart sounds: Normal heart sounds.  Pulmonary:     Effort: Pulmonary effort is normal.     Breath sounds: Normal breath sounds.  Abdominal:     Palpations: Abdomen is soft.  Musculoskeletal:        General: Normal range of motion.     Cervical back: Normal range of motion and neck supple.  Lymphadenopathy:     Cervical: Cervical adenopathy present.  Skin:    General: Skin is warm and dry.     Capillary Refill: Capillary refill takes less than 2 seconds.  Neurological:     General: No focal deficit present.     Mental Status: She is alert and oriented to person, place, and time.  Psychiatric:        Mood and Affect: Mood normal.        Behavior: Behavior normal.        Thought Content: Thought content normal.        Judgment: Judgment normal.      Assessment & Plan     1. Bacterial conjunctivitis of  both eyes Start amoxicillin 875 mg twice daily for 10 days. Add erythromycin eye ointment. Apply 1 cm ribbon of ointment to inner eyelid every 4 hours while awake. Keep scheduled appointment with eye doctor as scheduled.  - amoxicillin (AMOXIL) 875 MG tablet; Take 1 tablet (875 mg total) by mouth 2 (two) times daily.  Dispense: 20 tablet; Refill: 0 - erythromycin ophthalmic ointment; Apply 1 cm ribbon of ointment into eye every 4 hours while awake for next 7 days  Dispense: 1 g; Refill: 0  2. Supraclavicular lymphadenopathy Start amoxicillin 875 mg twice daily for 10 days. Take OTC tylenol and/or ibuprofen to reduce pain and tenderness.  - amoxicillin (AMOXIL) 875 MG tablet; Take 1 tablet (875 mg total) by mouth 2 (two) times daily.  Dispense: 20 tablet; Refill: 0  3. Scalp psoriasis Clobetasol shampoo may be used twice weekly as needed.  - clobetasol (TEMOVATE) 0.05 % external solution; Apply 1 Application  topically 2 (two) times daily.  Dispense: 50 mL; Refill: 2   Return in about 4 months (around 04/09/2023) for health maintenance exam, with pap, FBW a week prior to visit - ANA with reflex added .        Ronnell Freshwater, NP  [facility] [phone] (phone) [phone] (fax)  [place]

## 2022-12-27 DIAGNOSIS — H1013 Acute atopic conjunctivitis, bilateral: Secondary | ICD-10-CM | POA: Insufficient documentation

## 2023-01-09 DIAGNOSIS — R590 Localized enlarged lymph nodes: Secondary | ICD-10-CM | POA: Insufficient documentation

## 2023-01-09 DIAGNOSIS — L409 Psoriasis, unspecified: Secondary | ICD-10-CM | POA: Insufficient documentation

## 2023-01-09 DIAGNOSIS — B9689 Other specified bacterial agents as the cause of diseases classified elsewhere: Secondary | ICD-10-CM | POA: Insufficient documentation

## 2023-03-19 ENCOUNTER — Other Ambulatory Visit: Payer: Self-pay

## 2023-03-19 DIAGNOSIS — Z130 Encounter for screening for diseases of the blood and blood-forming organs and certain disorders involving the immune mechanism: Secondary | ICD-10-CM

## 2023-03-19 DIAGNOSIS — M064 Inflammatory polyarthropathy: Secondary | ICD-10-CM

## 2023-03-19 DIAGNOSIS — Z0000 Encounter for general adult medical examination without abnormal findings: Secondary | ICD-10-CM

## 2023-04-02 ENCOUNTER — Other Ambulatory Visit: Payer: PRIVATE HEALTH INSURANCE

## 2023-04-02 DIAGNOSIS — Z130 Encounter for screening for diseases of the blood and blood-forming organs and certain disorders involving the immune mechanism: Secondary | ICD-10-CM

## 2023-04-02 DIAGNOSIS — M064 Inflammatory polyarthropathy: Secondary | ICD-10-CM

## 2023-04-02 DIAGNOSIS — Z0000 Encounter for general adult medical examination without abnormal findings: Secondary | ICD-10-CM

## 2023-04-03 LAB — COMPREHENSIVE METABOLIC PANEL
ALT: 17 IU/L (ref 0–32)
AST: 19 IU/L (ref 0–40)
Albumin/Globulin Ratio: 2.1 (ref 1.2–2.2)
Albumin: 4.6 g/dL (ref 3.9–4.9)
Alkaline Phosphatase: 84 IU/L (ref 44–121)
BUN/Creatinine Ratio: 20 (ref 12–28)
BUN: 15 mg/dL (ref 8–27)
Bilirubin Total: 0.6 mg/dL (ref 0.0–1.2)
CO2: 26 mmol/L (ref 20–29)
Calcium: 11.1 mg/dL — ABNORMAL HIGH (ref 8.7–10.3)
Chloride: 105 mmol/L (ref 96–106)
Creatinine, Ser: 0.76 mg/dL (ref 0.57–1.00)
Globulin, Total: 2.2 g/dL (ref 1.5–4.5)
Glucose: 88 mg/dL (ref 70–99)
Potassium: 4.4 mmol/L (ref 3.5–5.2)
Sodium: 142 mmol/L (ref 134–144)
Total Protein: 6.8 g/dL (ref 6.0–8.5)
eGFR: 89 mL/min/{1.73_m2} (ref >59)

## 2023-04-03 LAB — CBC
Hematocrit: 40.7 % (ref 34.0–46.6)
Hemoglobin: 13 g/dL (ref 11.1–15.9)
MCH: 29.7 pg (ref 26.6–33.0)
MCHC: 31.9 g/dL (ref 31.5–35.7)
MCV: 93 fL (ref 79–97)
Platelets: 224 10*3/uL (ref 150–450)
RBC: 4.38 x10E6/uL (ref 3.77–5.28)
RDW: 12.8 % (ref 11.7–15.4)
WBC: 4.8 10*3/uL (ref 3.4–10.8)

## 2023-04-03 LAB — HEMOGLOBIN A1C
Est. average glucose Bld gHb Est-mCnc: 114 mg/dL
Hgb A1c MFr Bld: 5.6 % (ref 4.8–5.6)

## 2023-04-03 LAB — LIPID PANEL
Chol/HDL Ratio: 2.5 ratio (ref 0.0–4.4)
Cholesterol, Total: 201 mg/dL — ABNORMAL HIGH (ref 100–199)
HDL: 81 mg/dL (ref >39)
LDL Chol Calc (NIH): 105 mg/dL — ABNORMAL HIGH (ref 0–99)
Triglycerides: 83 mg/dL (ref 0–149)
VLDL Cholesterol Cal: 15 mg/dL (ref 5–40)

## 2023-04-03 LAB — TSH: TSH: 1.46 u[IU]/mL (ref 0.450–4.500)

## 2023-04-03 LAB — ANA W/REFLEX IF POSITIVE: Anti Nuclear Antibody (ANA): NEGATIVE

## 2023-04-09 ENCOUNTER — Ambulatory Visit (INDEPENDENT_AMBULATORY_CARE_PROVIDER_SITE_OTHER): Payer: PRIVATE HEALTH INSURANCE | Admitting: Family Medicine

## 2023-04-09 ENCOUNTER — Encounter: Payer: Self-pay | Admitting: Family Medicine

## 2023-04-09 VITALS — BP 111/74 | HR 65 | Resp 18 | Ht 64.0 in | Wt 116.0 lb

## 2023-04-09 DIAGNOSIS — Z0000 Encounter for general adult medical examination without abnormal findings: Secondary | ICD-10-CM

## 2023-04-09 DIAGNOSIS — Z1211 Encounter for screening for malignant neoplasm of colon: Secondary | ICD-10-CM | POA: Diagnosis not present

## 2023-04-09 DIAGNOSIS — Z1231 Encounter for screening mammogram for malignant neoplasm of breast: Secondary | ICD-10-CM | POA: Diagnosis not present

## 2023-04-09 DIAGNOSIS — Z1212 Encounter for screening for malignant neoplasm of rectum: Secondary | ICD-10-CM

## 2023-04-09 NOTE — Patient Instructions (Signed)
It was nice to meet you today, keep up the great work!

## 2023-04-09 NOTE — Progress Notes (Signed)
Complete physical exam  Patient: Monica Sweeney   DOB: 10/12/1961   62 y.o. Female  MRN: [id number]  Subjective:    Chief Complaint  Patient presents with   Annual Exam    Monica Sweeney is a 62 y.o. female who presents today for a complete physical exam. She reports consuming a general balanced healthy diet. Home exercise routine includes alternating between cardio and strength training daily. She generally feels well. She does not have additional problems to discuss today.    Most recent fall risk assessment:    12/09/2022    3:05 PM  Fall Risk   Falls in the past year? 0  Number falls in past yr: 0  Injury with Fall? 0     Most recent depression and anxiety screenings:    04/09/2023    8:09 AM 12/09/2022    3:05 PM  PHQ 2/9 Scores  PHQ - 2 Score 0 0  PHQ- 9 Score 0 0      04/09/2023    8:29 AM 12/09/2022    3:05 PM 11/25/2022    3:43 PM 09/17/2022    1:32 PM  GAD 7 : Generalized Anxiety Score  Nervous, Anxious, on Edge 0 0 0 0  Control/stop worrying 0 0 0 0  Worry too much - different things 0 0 0 0  Trouble relaxing 0 0 0 0  Restless 0 0 0 0  Easily annoyed or irritable 0 0 0 0  Afraid - awful might happen 0 0 0 0  Total GAD 7 Score 0 0 0 0  Anxiety Difficulty  Not difficult at all Not difficult at all Not difficult at all    Patient Active Problem List   Diagnosis Date Noted   Supraclavicular lymphadenopathy 01/09/2023   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   Allergic conjunctivitis of both eyes 12/27/2022   Hypercalcemia 08/05/2021   Single kidney 09/03/2012    Past Surgical History:  Procedure Laterality Date   INGUINAL HERNIA REPAIR     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   TONSILLECTOMY     Social History   Tobacco Use   Smoking status: Never    Passive exposure: Never   Smokeless tobacco: Never  Substance Use Topics   Alcohol use: Yes    Alcohol/week: 6.0 standard drinks of alcohol    Types: 6 Glasses of wine per week    Comment: occassional   Drug use:  No   Family History  Problem Relation Age of Onset   Hypertension Mother    Liver cancer Maternal Grandmother    Colon cancer Neg Hx    Breast cancer Neg Hx    CAD Neg Hx    Hypercalcemia Neg Hx    No Known Allergies   Patient Care Team: Melida Quitter, PA as PCP - General (Family Medicine) Patton Salles, MD as Consulting Physician (Obstetrics and Gynecology)   Outpatient Medications Prior to Visit  Medication Sig   clobetasol (TEMOVATE) 0.05 % external solution Apply 1 Application topically 2 (two) times daily. (Patient not taking: Reported on 04/09/2023)   erythromycin ophthalmic ointment Apply 1 cm ribbon of ointment into eye every 4 hours while awake for next 7 days (Patient not taking: Reported on 04/09/2023)   Olopatadine HCl 0.2 % SOLN Apply 1 drop to eye daily. (Patient not taking: Reported on 04/09/2023)   [DISCONTINUED] amoxicillin (AMOXIL) 875 MG tablet Take 1 tablet (875 mg total) by mouth 2 (two) times daily.   [  DISCONTINUED] ciprofloxacin (CILOXAN) 0.3 % ophthalmic solution Place 2 drops into both eyes 4 (four) times daily.   No facility-administered medications prior to visit.    Review of Systems  Constitutional:  Negative for chills, fever and malaise/fatigue.  HENT:  Negative for congestion, ear discharge, ear pain and hearing loss.   Eyes:  Negative for blurred vision and double vision.  Respiratory:  Negative for cough and shortness of breath.   Cardiovascular:  Negative for chest pain, palpitations and leg swelling.  Gastrointestinal:  Negative for abdominal pain, constipation, diarrhea and heartburn.  Genitourinary:  Negative for frequency and urgency.  Musculoskeletal:  Negative for myalgias and neck pain.  Neurological:  Negative for dizziness and headaches.  Psychiatric/Behavioral:  Negative for depression. The patient is not nervous/anxious.      Objective:    BP 111/74 (BP Location: Left Arm, Patient Position: Sitting, Cuff Size:  Normal)   Pulse 65   Resp 18   Ht 5\' 4"  (1.626 m)   Wt 116 lb (52.6 kg)   SpO2 99%   BMI 19.91 kg/m    Physical Exam Constitutional:      General: She is not in acute distress.    Appearance: Normal appearance.  HENT:     Head: Normocephalic and atraumatic.     Right Ear: Tympanic membrane, ear canal and external ear normal.     Left Ear: Tympanic membrane, ear canal and external ear normal.     Nose: Nose normal. No congestion or rhinorrhea.     Mouth/Throat:     Mouth: Mucous membranes are moist.     Pharynx: No oropharyngeal exudate or posterior oropharyngeal erythema.  Eyes:     Extraocular Movements: Extraocular movements intact.     Conjunctiva/sclera: Conjunctivae normal.     Pupils: Pupils are equal, round, and reactive to light.  Neck:     Thyroid: No thyroid mass, thyromegaly or thyroid tenderness.     Comments: Lymphadenopathy from a few months ago has resolved Cardiovascular:     Rate and Rhythm: Normal rate and regular rhythm.     Heart sounds: No murmur heard.    No friction rub. No gallop.  Pulmonary:     Effort: Pulmonary effort is normal.     Breath sounds: No wheezing, rhonchi or rales.  Abdominal:     General: Abdomen is flat. Bowel sounds are normal. There is no distension.     Palpations: There is no mass.     Tenderness: There is no abdominal tenderness.  Musculoskeletal:        General: Normal range of motion.     Cervical back: Normal range of motion and neck supple.     Right lower leg: No edema.     Left lower leg: No edema.  Lymphadenopathy:     Cervical: No cervical adenopathy.  Skin:    General: Skin is warm and dry.  Neurological:     Mental Status: She is alert and oriented to person, place, and time.     Cranial Nerves: No cranial nerve deficit.     Motor: No weakness.     Deep Tendon Reflexes: Reflexes normal.  Psychiatric:        Mood and Affect: Mood normal.       Assessment & Plan:    Routine Health Maintenance and  Physical Exam  Immunization History  Administered Date(s) Administered   Influenza, Seasonal, Injecte, Preservative Fre 12/09/2011   Pneumococcal Polysaccharide-23 05/12/2016   Td 04/01/2009  Tdap 04/09/2021    Health Maintenance  Topic Date Due   COVID-19 Vaccine (1) Never done   PAP SMEAR-Modifier  Never done   Zoster Vaccines- Shingrix (1 of 2) Never done   MAMMOGRAM  12/07/2012   Colonoscopy  01/20/2022   INFLUENZA VACCINE  06/17/2023   DTaP/Tdap/Td (3 - Td or Tdap) 04/10/2031   Hepatitis C Screening  Completed   HIV Screening  Completed   HPV VACCINES  Aged Out   We discussed recommended preventative care including mammograms, colorectal cancer screening, Pap smear.  Patient is agreeable to mammogram every 2 years which has been ordered today.  No family history of colorectal cancer, she has elected Cologuard for screening.  She will schedule follow-up appointment for Pap smear. She has never received the shingles shots.  We discussed recommendations, common side effects, and purpose for shingles shots.  We also discussed that it is a 2 dose series.  Patient states that she generally does not like vaccines, so she would like to do some research and read more about it before deciding to get it.  She is aware that she can have the 2 dose series at primary care office or at her pharmacy.  Reviewed recent lab results.  Calcium remains elevated but stable with last.  Cholesterol slightly elevated but also stable from last check, HDL is very high which is great.  Otherwise lab results were within normal limits.  Discussed health benefits of physical activity, and encouraged her to engage in regular exercise appropriate for her age and condition.  Encouraged her to continue with a balanced diet and exercising daily.  Wellness examination  Screening for colorectal cancer -     Cologuard  Screening mammogram for breast cancer -     Digital Screening Mammogram, Left and Right;  Future    Return in about 2 months (around 06/09/2023) for pap smear.     Melida Quitter, PA

## 2023-05-04 LAB — COLOGUARD: COLOGUARD: NEGATIVE

## 2023-06-02 ENCOUNTER — Ambulatory Visit: Payer: PRIVATE HEALTH INSURANCE

## 2023-06-10 ENCOUNTER — Ambulatory Visit: Payer: PRIVATE HEALTH INSURANCE | Admitting: Family Medicine

## 2023-12-23 ENCOUNTER — Encounter: Payer: Self-pay | Admitting: Family Medicine
